# Patient Record
Sex: Female | Born: 1950 | Race: White | Hispanic: No | Marital: Single | Smoking: Never smoker
Health system: Southern US, Community
[De-identification: ages and names within clinical notes are randomized; demographics above are authoritative.]

## PROBLEM LIST (undated history)

## (undated) DIAGNOSIS — E559 Vitamin D deficiency, unspecified: Secondary | ICD-10-CM

## (undated) DIAGNOSIS — C801 Malignant (primary) neoplasm, unspecified: Secondary | ICD-10-CM

## (undated) DIAGNOSIS — E663 Overweight: Secondary | ICD-10-CM

## (undated) DIAGNOSIS — T7840XA Allergy, unspecified, initial encounter: Secondary | ICD-10-CM

## (undated) HISTORY — DX: Malignant (primary) neoplasm, unspecified: C80.1

## (undated) HISTORY — DX: Allergy, unspecified, initial encounter: T78.40XA

## (undated) HISTORY — PX: TONSILLECTOMY AND ADENOIDECTOMY: SHX28

## (undated) HISTORY — PX: ENDOMETRIAL ABLATION: SHX621

## (undated) HISTORY — DX: Vitamin D deficiency, unspecified: E55.9

## (undated) HISTORY — DX: Overweight: E66.3

---

## 1998-11-21 ENCOUNTER — Other Ambulatory Visit: Admission: RE | Admit: 1998-11-21 | Discharge: 1998-11-21 | Payer: Self-pay | Admitting: Obstetrics and Gynecology

## 2000-09-17 ENCOUNTER — Other Ambulatory Visit: Admission: RE | Admit: 2000-09-17 | Discharge: 2000-09-17 | Payer: Self-pay | Admitting: *Deleted

## 2002-02-22 ENCOUNTER — Other Ambulatory Visit: Admission: RE | Admit: 2002-02-22 | Discharge: 2002-02-22 | Payer: Self-pay | Admitting: Obstetrics and Gynecology

## 2003-05-15 ENCOUNTER — Other Ambulatory Visit: Admission: RE | Admit: 2003-05-15 | Discharge: 2003-05-15 | Payer: Self-pay | Admitting: Obstetrics and Gynecology

## 2006-02-17 ENCOUNTER — Encounter: Admission: RE | Admit: 2006-02-17 | Discharge: 2006-02-17 | Payer: Self-pay | Admitting: *Deleted

## 2007-03-16 ENCOUNTER — Encounter: Admission: RE | Admit: 2007-03-16 | Discharge: 2007-03-16 | Payer: Self-pay | Admitting: Obstetrics and Gynecology

## 2009-06-18 ENCOUNTER — Encounter: Admission: RE | Admit: 2009-06-18 | Discharge: 2009-06-18 | Payer: Self-pay | Admitting: Internal Medicine

## 2009-06-26 ENCOUNTER — Encounter: Admission: RE | Admit: 2009-06-26 | Discharge: 2009-06-26 | Payer: Self-pay | Admitting: Internal Medicine

## 2010-05-12 ENCOUNTER — Encounter: Payer: Self-pay | Admitting: Internal Medicine

## 2011-10-08 ENCOUNTER — Encounter: Payer: Self-pay | Admitting: Family Medicine

## 2011-10-14 ENCOUNTER — Ambulatory Visit: Payer: BC Managed Care – PPO

## 2011-10-14 ENCOUNTER — Ambulatory Visit (INDEPENDENT_AMBULATORY_CARE_PROVIDER_SITE_OTHER): Payer: BC Managed Care – PPO | Admitting: Family Medicine

## 2011-10-14 VITALS — BP 136/84 | HR 82 | Temp 98.3°F | Resp 16 | Ht 67.0 in | Wt 196.0 lb

## 2011-10-14 DIAGNOSIS — M79673 Pain in unspecified foot: Secondary | ICD-10-CM

## 2011-10-14 DIAGNOSIS — R5383 Other fatigue: Secondary | ICD-10-CM

## 2011-10-14 DIAGNOSIS — Z0000 Encounter for general adult medical examination without abnormal findings: Secondary | ICD-10-CM

## 2011-10-14 DIAGNOSIS — M79609 Pain in unspecified limb: Secondary | ICD-10-CM

## 2011-10-14 DIAGNOSIS — E7800 Pure hypercholesterolemia, unspecified: Secondary | ICD-10-CM

## 2011-10-14 DIAGNOSIS — Z01419 Encounter for gynecological examination (general) (routine) without abnormal findings: Secondary | ICD-10-CM

## 2011-10-14 DIAGNOSIS — R5381 Other malaise: Secondary | ICD-10-CM

## 2011-10-14 LAB — POCT CBC
HCT, POC: 42.4 % (ref 37.7–47.9)
Hemoglobin: 13.8 g/dL (ref 12.2–16.2)
Lymph, poc: 2.1 (ref 0.6–3.4)
MCH, POC: 28.8 pg (ref 27–31.2)
MCHC: 32.5 g/dL (ref 31.8–35.4)
WBC: 5.8 10*3/uL (ref 4.6–10.2)

## 2011-10-14 LAB — POCT UA - MICROSCOPIC ONLY
Casts, Ur, LPF, POC: NEGATIVE
Yeast, UA: NEGATIVE

## 2011-10-14 LAB — POCT URINALYSIS DIPSTICK
Bilirubin, UA: NEGATIVE
Leukocytes, UA: NEGATIVE
Nitrite, UA: NEGATIVE
Urobilinogen, UA: 0.2
pH, UA: 5.5

## 2011-10-14 LAB — LIPID PANEL
LDL Cholesterol: 122 mg/dL — ABNORMAL HIGH (ref 0–99)
Triglycerides: 101 mg/dL (ref <150)
VLDL: 20 mg/dL (ref 0–40)

## 2011-10-14 LAB — COMPREHENSIVE METABOLIC PANEL
CO2: 26 mEq/L (ref 19–32)
Calcium: 9.4 mg/dL (ref 8.4–10.5)
Creat: 0.82 mg/dL (ref 0.50–1.10)
Glucose, Bld: 93 mg/dL (ref 70–99)
Sodium: 142 mEq/L (ref 135–145)
Total Bilirubin: 0.5 mg/dL (ref 0.3–1.2)
Total Protein: 6.9 g/dL (ref 6.0–8.3)

## 2011-10-14 LAB — TSH: TSH: 0.973 u[IU]/mL (ref 0.350–4.500)

## 2011-10-14 MED ORDER — BUPROPION HCL ER (SR) 150 MG PO TB12: 150.0000 mg | ORAL_TABLET | Freq: Two times a day (BID) | ORAL | Status: DC

## 2011-10-14 MED ORDER — FLUTICASONE PROPIONATE 50 MCG/ACT NA SUSP: 2.0000 | Freq: Every day | NASAL | Status: DC

## 2011-10-14 NOTE — Progress Notes (Signed)
  Subjective:    Patient ID: Linda Cisneros, female    DOB: November 01, 1950, 61 y.o.   MRN: [id number]  HPI60 yr old CF here for CPE. See patient health survey that is scanned in. Patient c/o overeating/weight gain.  Also fatigue. H/O vitamin D deficiency.  C/O L foot pain since turning her foot/tripping over a step when exercise walking about 1 year ago. Esp aggravated by certain shoes.  Dermatology check up for melanoma was fine.  Review of Systems  All other systems reviewed and are negative.       Objective:   Physical Exam    Results for orders placed in visit on 10/14/11  POCT CBC      Component Value Range   WBC 5.8  4.6 - 10.2 K/uL   Lymph, poc 2.1  0.6 - 3.4   POC LYMPH PERCENT 35.8  10 - 50 %L   MID (cbc) 0.4  0 - 0.9   POC MID % 6.8  0 - 12 %M   POC Granulocyte 3.3  2 - 6.9   Granulocyte percent 57.4  37 - 80 %G   RBC 4.80  4.04 - 5.48 M/uL   Hemoglobin 13.8  12.2 - 16.2 g/dL   HCT, POC 40.9  81.1 - 47.9 %   MCV 88.4  80 - 97 fL   MCH, POC 28.8  27 - 31.2 pg   MCHC 32.5  31.8 - 35.4 g/dL   RDW, POC 91.4     Platelet Count, POC 328  142 - 424 K/uL   MPV 9.0  0 - 99.8 fL  POCT URINALYSIS DIPSTICK      Component Value Range   Color, UA yellow     Clarity, UA clear     Glucose, UA neg     Bilirubin, UA neg     Ketones, UA neg     Spec Grav, UA 1.025     Blood, UA neg     pH, UA 5.5     Protein, UA neg     Urobilinogen, UA 0.2     Nitrite, UA neg     Leukocytes, UA Negative    POCT UA - MICROSCOPIC ONLY      Component Value Range   WBC, Ur, HPF, POC neg     RBC, urine, microscopic 0-1     Bacteria, U Microscopic trace     Mucus, UA neg     Epithelial cells, urine per micros 3-5     Crystals, Ur, HPF, POC neg     Casts, Ur, LPF, POC neg     Yeast, UA neg     [facility] reading (PRIMARY) by  Dr. Patsy Lager as NAD.      Assessment & Plan:  CPE Obesity-resume walking Fatigue-healthy diet to increase energy.  Advised [employer] Diet. Decrease  alcohol/salt/sugar and other white carbs.  Foot pain-?morton's neuroma.  Advised comfortable footwear and arch support. Overeating-I believe she has an anxiety component that may be helped by a trial of Wellbutrin. Rhinitis-start flonase Gave SBE card for shower.  Taught and encouraged and she will make an appt to get her MMG for this year.

## 2011-10-14 NOTE — Patient Instructions (Signed)
Read and try [facility] Diet

## 2011-10-15 ENCOUNTER — Telehealth: Payer: Self-pay

## 2011-10-15 LAB — VITAMIN D 25 HYDROXY (VIT D DEFICIENCY, FRACTURES): Vit D, 25-Hydroxy: 27 ng/mL — ABNORMAL LOW (ref 30–89)

## 2011-10-15 MED ORDER — VITAMIN D (ERGOCALCIFEROL) 1.25 MG (50000 UNIT) PO CAPS: 50000.0000 [IU] | ORAL_CAPSULE | ORAL | Status: DC

## 2011-10-15 NOTE — Telephone Encounter (Signed)
Pt returning phone call regarding lab results please contact pt at work [phone] ext [phone]

## 2011-10-15 NOTE — Telephone Encounter (Signed)
See labs 

## 2011-10-15 NOTE — Addendum Note (Signed)
Addended by: Anders Simmonds on: 10/15/2011 07:39 AM   Modules accepted: Orders

## 2011-10-16 LAB — PAP IG (IMAGE GUIDED)

## 2011-10-21 ENCOUNTER — Other Ambulatory Visit: Payer: Self-pay | Admitting: Physician Assistant

## 2012-01-26 LAB — HM MAMMOGRAPHY: HM Mammogram: NEGATIVE

## 2012-03-16 ENCOUNTER — Encounter: Payer: Self-pay | Admitting: Radiology

## 2012-10-14 ENCOUNTER — Ambulatory Visit (INDEPENDENT_AMBULATORY_CARE_PROVIDER_SITE_OTHER): Payer: BC Managed Care – PPO | Admitting: Family Medicine

## 2012-10-14 ENCOUNTER — Encounter: Payer: Self-pay | Admitting: Family Medicine

## 2012-10-14 VITALS — BP 156/85 | HR 91 | Temp 97.9°F | Resp 16 | Ht 67.0 in | Wt 196.0 lb

## 2012-10-14 DIAGNOSIS — Z0000 Encounter for general adult medical examination without abnormal findings: Secondary | ICD-10-CM

## 2012-10-14 DIAGNOSIS — J309 Allergic rhinitis, unspecified: Secondary | ICD-10-CM

## 2012-10-14 DIAGNOSIS — Z789 Other specified health status: Secondary | ICD-10-CM

## 2012-10-14 DIAGNOSIS — E559 Vitamin D deficiency, unspecified: Secondary | ICD-10-CM

## 2012-10-14 LAB — POCT URINALYSIS DIPSTICK
Bilirubin, UA: NEGATIVE
Glucose, UA: NEGATIVE
Leukocytes, UA: NEGATIVE
Nitrite, UA: NEGATIVE
Urobilinogen, UA: 0.2

## 2012-10-14 LAB — BASIC METABOLIC PANEL
Calcium: 9.3 mg/dL (ref 8.4–10.5)
Potassium: 4.1 mEq/L (ref 3.5–5.3)
Sodium: 140 mEq/L (ref 135–145)

## 2012-10-14 LAB — LDL CHOLESTEROL, DIRECT: Direct LDL: 122 mg/dL — ABNORMAL HIGH

## 2012-10-14 MED ORDER — FLUTICASONE PROPIONATE 50 MCG/ACT NA SUSP: 2.0000 | Freq: Every day | NASAL | Status: DC

## 2012-10-14 NOTE — Progress Notes (Signed)
Subjective:    Patient ID: Linda Cisneros, female    DOB: January 22, 1951, 62 y.o.   MRN: [id number]  HPI  This 62 y.o. Cauc female is here for CPE; PAP is not needed. Pt has a hx of Vit D def but is  not taking a supplement. She also has seasonal allergies and uses OTC antihistamine prn.    HCM: PAP- current (sees GYN).            MMG- current (per GYN)     PMHx, Soc Hx and Fam Hx reviewed.   Review of Systems  Constitutional: Positive for fatigue. Negative for fever, diaphoresis, activity change and appetite change.  HENT: Positive for sinus pressure. Negative for congestion, sore throat, rhinorrhea and postnasal drip.   Eyes: Positive for itching.  Respiratory: Negative.   Cardiovascular: Negative.   Gastrointestinal: Positive for nausea.  All other systems reviewed and are negative.       Objective:   Physical Exam  Nursing note and vitals reviewed. Constitutional: She is oriented to person, place, and time. Vital signs are normal. She appears well-developed and well-nourished. No distress.  HENT:  Head: Normocephalic and atraumatic.  Right Ear: Hearing, tympanic membrane, external ear and ear canal normal.  Left Ear: Hearing, tympanic membrane, external ear and ear canal normal.  Nose: Nose normal. No mucosal edema, rhinorrhea, nasal deformity or septal deviation. Right sinus exhibits no maxillary sinus tenderness and no frontal sinus tenderness. Left sinus exhibits no maxillary sinus tenderness and no frontal sinus tenderness.  Mouth/Throat: Uvula is midline, oropharynx is clear and moist and mucous membranes are normal. No oral lesions. Normal dentition. No dental caries.  Eyes: Conjunctivae and EOM are normal. Pupils are equal, round, and reactive to light. No scleral icterus.  Neck: Normal range of motion. Neck supple. No thyromegaly present.  Cardiovascular: Normal rate, regular rhythm and normal heart sounds.  Exam reveals no gallop and no friction rub.   No murmur  heard. Pulmonary/Chest: Effort normal and breath sounds normal. No respiratory distress.  Abdominal: Soft. Normal appearance and bowel sounds are normal. She exhibits no distension, no pulsatile midline mass and no mass. There is no hepatosplenomegaly. There is no tenderness. There is no guarding and no CVA tenderness.  Genitourinary: Rectum normal. Rectal exam shows no external hemorrhoid, no fissure, no mass, no tenderness and anal tone normal. Guaiac negative stool.  NEFG.  Musculoskeletal: Normal range of motion. She exhibits no edema and no tenderness.  Lymphadenopathy:       Head (right side): No submental, no submandibular, no tonsillar, no posterior auricular and no occipital adenopathy present.       Head (left side): No submental, no submandibular, no tonsillar, no posterior auricular and no occipital adenopathy present.    She has no cervical adenopathy.       Right: No supraclavicular adenopathy present.       Left: No supraclavicular adenopathy present.  Neurological: She is alert and oriented to person, place, and time. She has normal strength and normal reflexes. She displays no atrophy. No cranial nerve deficit or sensory deficit. She exhibits normal muscle tone. Coordination and gait normal.  Skin: Skin is warm and dry. No rash noted. No erythema. No pallor.  Psychiatric: She has a normal mood and affect. Her behavior is normal. Judgment and thought content normal.    Results for orders placed in visit on 10/14/12  POCT URINALYSIS DIPSTICK      Result Value Range   Color, UA yellow  Clarity, UA slightly cloudy     Glucose, UA neg     Bilirubin, UA neg     Ketones, UA neg     Spec Grav, UA 1.020     Blood, UA neg     pH, UA 6.5     Protein, UA neg     Urobilinogen, UA 0.2     Nitrite, UA neg     Leukocytes, UA Negative    IFOBT (OCCULT BLOOD)      Result Value Range   IFOBT Negative         Assessment & Plan:  Routine general medical examination at a health  care facility - Plan: Basic metabolic panel, POCT urinalysis dipstick, IFOBT POC (occult bld, rslt in office)  Unspecified vitamin D deficiency - Advised OTC Vit D3 supplement daily or Calcium + D supplement.    Plan: Vitamin D, 25-hydroxy  Allergic rhinitis- Advised pt to take allergy medication daily.  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

## 2012-10-14 NOTE — Patient Instructions (Signed)
Keeping You Healthy  Get These Tests  Blood Pressure- Have your blood pressure checked by your healthcare provider at least once a year.  Normal blood pressure is 120/80.  Weight- Have your body mass index (BMI) calculated to screen for obesity.  BMI is a measure of body fat based on height and weight.  You can calculate your own BMI at https://www.west-esparza.com/  Cholesterol- Have your cholesterol checked every year.  Diabetes- Have your blood sugar checked every year if you have high blood pressure, high cholesterol, a family history of diabetes or if you are overweight.  Pap Smear- Have a pap smear every 1 to 3 years if you have been sexually active.  If you are older than 65 and recent pap smears have been normal you may not need additional pap smears.  In addition, if you have had a hysterectomy  For benign disease additional pap smears are not necessary.  Mammogram-Yearly mammograms are essential for early detection of breast cancer  Screening for Colon Cancer- Colonoscopy starting at age 60. Screening may begin sooner depending on your family history and other health conditions.  Follow up colonoscopy as directed by your Gastroenterologist.  Screening for Osteoporosis- Screening begins at age 60 with bone density scanning, sooner if you are at higher risk for developing Osteoporosis.  Get these medicines  Calcium with Vitamin D- Your body requires 1200-1500 mg of Calcium a day and [id number] IU of Vitamin D a day.  You can only absorb 500 mg of Calcium at a time therefore Calcium must be taken in 2 or 3 separate doses throughout the day.  Hormones- Hormone therapy has been associated with increased risk for certain cancers and heart disease.  Talk to your healthcare provider about if you need relief from menopausal symptoms.  Aspirin- Ask your healthcare provider about taking Aspirin to prevent Heart Disease and Stroke.  Get these Immuniztions  Flu shot- Every fall  Pneumonia  shot- Once after the age of 31; if you are younger ask your healthcare provider if you need a pneumonia shot.  Tetanus- Every ten years. You received a Tdap in 2010; next Tetanus is due in 2020.  Zostavax- Once after the age of 47 to prevent shingles. You have a prescription for this vaccine; take it at [facility] or [employer] at [facility].  Take these steps  Don't smoke- Your healthcare provider can help you quit. For tips on how to quit, ask your healthcare provider or go to www.smokefree.gov or call 1-800 QUIT-NOW.  Be physically active- Exercise 5 days a week for a minimum of 30 minutes.  If you are not already physically active, start slow and gradually work up to 30 minutes of moderate physical activity.  Try walking, dancing, bike riding, swimming, etc.  Eat a healthy diet- Eat a variety of healthy foods such as fruits, vegetables, whole grains, low fat milk, low fat cheeses, yogurt, lean meats, chicken, fish, eggs, dried beans, tofu, etc.  For more information go to www.thenutritionsource.org  Dental visit- Brush and floss teeth twice daily; visit your dentist twice a year.  Eye exam- Visit your Optometrist or Ophthalmologist yearly.  Drink alcohol in moderation- Limit alcohol intake to one drink or less a day.  Never drink and drive.  Depression- Your emotional health is as important as your physical health.  If you're feeling down or losing interest in things you normally enjoy, please talk to your healthcare provider.  Seat Belts- can save your life; always  wear one  Smoke/Carbon Monoxide detectors- These detectors need to be installed on the appropriate level of your home.  Replace batteries at least once a year.  Violence- If anyone is threatening or hurting you, please tell your healthcare provider.  Living Will/ Health care power of attorney- Discuss with your healthcare provider and family.   Vitamin D Deficiency Vitamin D is an important vitamin  that your body needs. Having too little of it in your body is called a deficiency. A very bad deficiency can make your bones soft and can cause a condition called rickets.  Vitamin D is important to your body for different reasons, such as:   It helps your body absorb 2 minerals called calcium and phosphorus.  It helps make your bones healthy.  It may prevent some diseases, such as diabetes and multiple sclerosis.  It helps your muscles and heart. You can get vitamin D in several ways. It is a natural part of some foods. The vitamin is also added to some dairy products and cereals. Some people take vitamin D supplements. Also, your body makes vitamin D when you are in the sun. It changes the sun's rays into a form of the vitamin that your body can use. CAUSES   Not eating enough foods that contain vitamin D.  Not getting enough sunlight.  Having certain digestive system diseases that make it hard to absorb vitamin D. These diseases include Crohn's disease, chronic pancreatitis, and cystic fibrosis.  Having a surgery in which part of the stomach or small intestine is removed.  Being obese. Fat cells pull vitamin D out of your blood. That means that obese people may not have enough vitamin D left in their blood and in other body tissues.  Having chronic kidney or liver disease. RISK FACTORS Risk factors are things that make you more likely to develop a vitamin D deficiency. They include:  Being older.  Not being able to get outside very much.  Living in a nursing home.  Having had broken bones.  Having weak or thin bones (osteoporosis).  Having a disease or condition that changes how your body absorbs vitamin D.  Having dark skin.  Some medicines such as seizure medicines or steroids.  Being overweight or obese. SYMPTOMS Mild cases of vitamin D deficiency may not have any symptoms. If you have a very bad case, symptoms may include:  Bone pain.  Muscle pain.  Falling  often.  Broken bones caused by a minor injury, due to osteoporosis. DIAGNOSIS A blood test is the best way to tell if you have a vitamin D deficiency. TREATMENT Vitamin D deficiency can be treated in different ways. Treatment for vitamin D deficiency depends on what is causing it. Options include:  Taking vitamin D supplements.  Taking a calcium supplement. Your caregiver will suggest what dose is best for you. HOME CARE INSTRUCTIONS  Take any supplements that your caregiver prescribes. Follow the directions carefully. Take only the suggested amount.  Have your blood tested 2 months after you start taking supplements.  Eat foods that contain vitamin D. Healthy choices include:  Fortified dairy products, cereals, or juices. Fortified means vitamin D has been added to the food. Check the label on the package to be sure.  Fatty fish like salmon or trout.  Eggs.  Oysters.  Spend some time in the sun. Most people should get out in the sun without sunblock for 10 to 15 minutes at a time. Do this 3 times a  week. People who have had skin cancer should not do this. Ask your caregiver how long you should be in the sun. Do not use a tanning bed.  Keep your weight at a healthy level. Lose weight if you need to.  Keep all follow-up appointments. Your caregiver will need to perform blood tests to make sure your vitamin D deficiency is going away. SEEK MEDICAL CARE IF:  You have any questions about your treatment.  You continue to have symptoms of vitamin D deficiency.  You have nausea or vomiting.  You are constipated.  You feel confused.  You have severe abdominal or back pain. MAKE SURE YOU:  Understand these instructions.  Will watch your condition.  Will get help right away if you are not doing well or get worse. Document Released: 06/30/2011 Document Reviewed: 06/30/2011 [facility] Patient Information 2014 [place], [place].   You may want to get Citracal + D or  Oscal +D  for calcium and Vit D supplementation. This is recommended for women after they stop having periods.

## 2012-10-15 DIAGNOSIS — E663 Overweight: Secondary | ICD-10-CM | POA: Insufficient documentation

## 2012-10-15 DIAGNOSIS — Z789 Other specified health status: Secondary | ICD-10-CM | POA: Insufficient documentation

## 2012-10-15 DIAGNOSIS — E559 Vitamin D deficiency, unspecified: Secondary | ICD-10-CM | POA: Insufficient documentation

## 2012-10-15 DIAGNOSIS — J309 Allergic rhinitis, unspecified: Secondary | ICD-10-CM | POA: Insufficient documentation

## 2012-10-15 LAB — VITAMIN D 25 HYDROXY (VIT D DEFICIENCY, FRACTURES): Vit D, 25-Hydroxy: 28 ng/mL — ABNORMAL LOW (ref 30–89)

## 2012-10-16 NOTE — Progress Notes (Signed)
Quick Note:  Please contact pt and advise that the following labs are abnormal... Vitamin D level is about the same as 1 year ago. Get OTC Vitamin D3 2000 IU and take it daily. Get some sun exposure for 10-15 minutes most days of the week.  Kidney function, blood sugar and electrolytes are normal. LDL ("bad") cholesterol is above 100; work on healthier nutrition and regular exercise.  Copy to pt. ______

## 2012-10-18 ENCOUNTER — Encounter: Payer: Self-pay | Admitting: Family Medicine

## 2012-10-19 ENCOUNTER — Telehealth: Payer: Self-pay

## 2012-10-19 NOTE — Telephone Encounter (Signed)
PT STATES SHE RECEIVED A CALL REGARDING HER LABS. PLEASE CALL HER WORK AT [phone] [phone]

## 2012-10-19 NOTE — Telephone Encounter (Signed)
  Notes Recorded by Maurice March, MD on 10/16/2012 at 4:35 PM Please contact pt and advise that the following labs are abnormal... Vitamin D level is about the same as 1 year ago. Get OTC Vitamin D3 2000 IU and take it daily. Get some sun exposure for 10-15 minutes most days of the week.  Kidney function, blood sugar and electrolytes are normal. LDL ("bad") cholesterol is above 100; work on healthier nutrition and regular exercise.  Copy to pt.  Patient advised.

## 2012-11-05 ENCOUNTER — Ambulatory Visit (INDEPENDENT_AMBULATORY_CARE_PROVIDER_SITE_OTHER): Payer: BC Managed Care – PPO | Admitting: Emergency Medicine

## 2012-11-05 VITALS — BP 120/80 | HR 86 | Temp 98.0°F | Resp 16 | Ht 65.5 in | Wt 196.0 lb

## 2012-11-05 DIAGNOSIS — K120 Recurrent oral aphthae: Secondary | ICD-10-CM

## 2012-11-05 MED ORDER — MAGIC MOUTHWASH W/LIDOCAINE: 10.0000 mL | ORAL | Status: DC | PRN

## 2012-11-05 NOTE — Patient Instructions (Addendum)
Canker Sores   Canker sores are painful, open sores on the inside of the mouth and cheek. They may be white or yellow. The sores usually heal in 1 to 2 weeks. Women are more likely than men to have recurrent canker sores.  CAUSES  The cause of canker sores is not well understood. More than one cause is likely. Canker sores do not appear to be caused by certain types of germs (viruses or bacteria). Canker sores may be caused by:   An allergic reaction to certain foods.   Digestive problems.   Not having enough vitamin B12, folic acid, and iron.   Female sex hormones. Sores may come only during certain phases of a menstrual cycle. Often, there is improvement during pregnancy.   Genetics. Some people seem to inherit canker sore problems.  Emotional stress and injuries to the mouth may trigger outbreaks, but not cause them.   DIAGNOSIS  Canker sores are diagnosed by exam.   TREATMENT   Patients who have frequent bouts of canker sores may have cultures taken of the sores, blood tests, or allergy tests. This helps determine if their sores are caused by a poor diet, an allergy, or some other preventable or treatable disease.   Vitamins may prevent recurrences or reduce the severity of canker sores in people with poor nutrition.   Numbing ointments can relieve pain. These are available in drug stores without a prescription.   Anti-inflammatory steroid mouth rinses or gels may be prescribed by your caregiver for severe sores.   Oral steroids may be prescribed if you have severe, recurrent canker sores. These strong medicines can cause many side effects and should be used only under the close direction of a dentist or physician.   Mouth rinses containing the antibiotic medicine may be prescribed. They may lessen symptoms and speed healing.  Healing usually happens in about 1 or 2 weeks with or without treatment. Certain antibiotic mouth rinses given to pregnant women and young children can permanently stain teeth.  Talk to your caregiver about your treatment.  HOME CARE INSTRUCTIONS    Avoid foods that cause canker sores for you.   Avoid citrus juices, spicy or salty foods, and coffee until the sores are healed.   Use a soft-bristled toothbrush.   Chew your food carefully to avoid biting your cheek.   Apply topical numbing medicine to the sore to help relieve pain.   Apply a thin paste of baking soda and water to the sore to help heal the sore.   Only use mouth rinses or medicines for pain or discomfort as directed by your caregiver.  SEEK MEDICAL CARE IF:    Your symptoms are not better in 1 week.   Your sores are still present after 2 weeks.   Your sores are very painful.   You have trouble breathing or swallowing.   Your sores come back frequently.  Document Released: 08/02/2010 Document Revised: 06/30/2011 Document Reviewed: 08/02/2010  [employer] Patient Information 2014 [employer].

## 2012-11-05 NOTE — Progress Notes (Signed)
[facility] [address], [place] [address] [phone]  Date:  11/05/2012   Name:  Linda Cisneros   DOB:  24-Jul-1950   MRN:  [id number]  PCP:  Dow Adolph, MD    Chief Complaint: blisters   History of Present Illness:  Linda Cisneros is a 62 y.o. very [place] female patient who presents with the following:  Found blisters on her tongue last week.  They have spread to the other side of her mouth now and she has hoarseness. Some dysphagia.  Some mucoid nasal drainage.  No fever or chills, cough, wheezing or shortness of breath.  No nausea or vomiting.  No improvement with over the counter medications or other home remedies. Denies other complaint or health concern today.   Patient Active Problem List   Diagnosis Date Noted  . Unspecified vitamin D deficiency 10/15/2012  . Allergic rhinitis 10/15/2012  . 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 Overweight 10/15/2012    Past Medical History  Diagnosis Date  . Allergy     Past Surgical History  Procedure Laterality Date  . Endometrial ablation    . Tonsillectomy and adenoidectomy      History  Substance Use Topics  . Smoking status: Never Smoker   . Smokeless tobacco: Not on file  . Alcohol Use: Not on file    Family History  Problem Relation Age of Onset  . COPD Father   . Heart disease Sister     Allergies  Allergen Reactions  . Tetracyclines & Related Rash    Medication list has been reviewed and updated.  Current Outpatient Prescriptions on File Prior to Visit  Medication Sig Dispense Refill  . fluticasone (FLONASE) 50 MCG/ACT nasal spray Place 2 sprays into the nose daily.  16 g  6   No current facility-administered medications on file prior to visit.    Review of Systems:  As per HPI, otherwise negative.    Physical Examination: Filed Vitals:   11/05/12 1033  BP: 120/80  Pulse: 86  Temp: 98 F (36.7  C)  Resp: 16   Filed Vitals:   11/05/12 1033  Height: 5' 5.5" (1.664 m)  Weight: 196 lb (88.905 kg)   Body mass index is 32.11 kg/(m^2). Ideal Body Weight: Weight in (lb) to have BMI = 25: 152.2  GEN: WDWN, NAD, Non-toxic, A & O x 3 HEENT: Atraumatic, Normocephalic. Neck supple. No masses, No LAD.  Aphthous ulcers  Ears and Nose: No external deformity. CV: RRR, No M/G/R. No JVD. No thrill. No extra heart sounds. PULM: CTA B, no wheezes, crackles, rhonchi. No retractions. No resp. distress. No accessory muscle use. ABD: S, NT, ND, +BS. No rebound. No HSM. EXTR: No c/c/e NEURO Normal gait.  PSYCH: Normally interactive. Conversant. Not depressed or anxious appearing.  Calm demeanor.    Assessment and Plan: Aphthous ulcerations Dukes mouthwash   Signed,  Phillips Odor, MD

## 2013-01-28 ENCOUNTER — Ambulatory Visit (INDEPENDENT_AMBULATORY_CARE_PROVIDER_SITE_OTHER): Payer: BC Managed Care – PPO | Admitting: Family Medicine

## 2013-01-28 VITALS — BP 128/84 | HR 81 | Temp 98.6°F | Resp 18 | Ht 65.5 in | Wt 196.0 lb

## 2013-01-28 DIAGNOSIS — H8110 Benign paroxysmal vertigo, unspecified ear: Secondary | ICD-10-CM

## 2013-01-28 DIAGNOSIS — J3489 Other specified disorders of nose and nasal sinuses: Secondary | ICD-10-CM

## 2013-01-28 MED ORDER — AMOXICILLIN-POT CLAVULANATE 875-125 MG PO TABS: 1.0000 | ORAL_TABLET | Freq: Two times a day (BID) | ORAL | Status: DC

## 2013-01-28 NOTE — Progress Notes (Signed)
[facility] [address], [place] [address] [phone]  Date:  01/28/2013   Name:  Linda Cisneros   DOB:  12/13/1950   MRN:  [id number]  PCP:  Dow Adolph, MD    Chief Complaint: sinus issues and vitamin d level was drawn last visit   History of Present Illness:  Linda Cisneros is a 62 y.o. very List female patient who presents with the following:  For about 2 weeks she had copious nasal discharge and congestion.  This is now the 3rd week- her nasal sx have improved but a couple of days ago she noted some vertigo with head movement.  This happened twice, lasted just a moment and was associated with moving her head or rolling over in bed.    Her congestion is now better.  Her left ear hurts a little bit.   She is no longer blowing much out of her sinuses.  She notes a "teeny tiny headache."   No cough.    No GI symptoms. She has felt tired.  She is generally healthy   Patient Active Problem List   Diagnosis Date Noted  . Unspecified vitamin D deficiency 10/15/2012  . Allergic rhinitis 10/15/2012  . 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 Overweight 10/15/2012    Past Medical History  Diagnosis Date  . Allergy   . Cancer     Past Surgical History  Procedure Laterality Date  . Endometrial ablation    . Tonsillectomy and adenoidectomy      History  Substance Use Topics  . Smoking status: Never Smoker   . Smokeless tobacco: Not on file  . Alcohol Use: Not on file    Family History  Problem Relation Age of Onset  . COPD Father   . Heart disease Sister     Allergies  Allergen Reactions  . Tetracyclines & Related Rash    Medication list has been reviewed and updated.  Current Outpatient Prescriptions on File Prior to Visit  Medication Sig Dispense Refill  . Alum & Mag Hydroxide-Simeth (MAGIC MOUTHWASH W/LIDOCAINE) SOLN Take 10 mLs by mouth every 2 (two) hours  as needed.  360 mL  0  . fluticasone (FLONASE) 50 MCG/ACT nasal spray Place 2 sprays into the nose daily.  16 g  6   No current facility-administered medications on file prior to visit.    Review of Systems:  As per HPI- otherwise negative.   Physical Examination: Filed Vitals:   01/28/13 1813  BP: 128/84  Pulse: 81  Temp: 98.6 F (37 C)  Resp: 18   Filed Vitals:   01/28/13 1813  Height: 5' 5.5" (1.664 m)  Weight: 196 lb (88.905 kg)   Body mass index is 32.11 kg/(m^2). Ideal Body Weight: Weight in (lb) to have BMI = 25: 152.2  GEN: WDWN, NAD, Non-toxic, A & O x 3, overweight, looks well HEENT: Atraumatic, Normocephalic. Neck supple. No masses, No LAD.  Bilateral TM wnl, oropharynx normal.  PEERL,EOMI.   Ears and Nose: No external deformity. CV: RRR, No M/G/R. No JVD. No thrill. No extra heart sounds. PULM: CTA B, no wheezes, crackles, rhonchi. No retractions. No resp. distress. No accessory muscle use. ABD: S, NT, ND. No rebound. No HSM. EXTR: No c/c/e NEURO Normal gait. Normal strength, sensation, DTR all extremities, normal RAM of hands, negative romberg PSYCH: Normally interactive. Conversant. Not depressed or anxious appearing.  Calm demeanor.   Able  to reproduce vertigo with Tonia Brooms   Assessment and Plan: Sinus pressure - Plan: amoxicillin-clavulanate (AUGMENTIN) 875-125 MG per tablet  BPPV (benign paroxysmal positional vertigo)  Likely BPPV- mild and not currently bothersome.  Discussed with her.  She will let me know if sx persist.   Gave rx for augmentin to hold.  At this time her sinus sx are improving but she can use this rx if sinus pressure, pain and congestion return.   Signed Abbe Amsterdam, MD

## 2013-01-28 NOTE — Patient Instructions (Signed)
It appears that you have BPPV.  If your symptoms persist for more than a couple of days please give me a call. If you have persistent pressure in your sinuses take the augmentin rx.    If you have any severe headache or other concerning symptoms please let me know right away  Benign Positional Vertigo Vertigo means you feel like you or your surroundings are moving when they are not. Benign positional vertigo is the most common form of vertigo. Benign means that the cause of your condition is not serious. Benign positional vertigo is more common in older adults. CAUSES  Benign positional vertigo is the result of an upset in the labyrinth system. This is an area in the middle ear that helps control your balance. This may be caused by a viral infection, head injury, or repetitive motion. However, often no specific cause is found. SYMPTOMS  Symptoms of benign positional vertigo occur when you move your head or eyes in different directions. Some of the symptoms may include:  Loss of balance and falls.  Vomiting.  Blurred vision.  Dizziness.  Nausea.  Involuntary eye movements (nystagmus). DIAGNOSIS  Benign positional vertigo is usually diagnosed by physical exam. If the specific cause of your benign positional vertigo is unknown, your caregiver may perform imaging tests, such as magnetic resonance imaging (MRI) or computed tomography (CT). TREATMENT  Your caregiver may recommend movements or procedures to correct the benign positional vertigo. Medicines such as meclizine, benzodiazepines, and medicines for nausea may be used to treat your symptoms. In rare cases, if your symptoms are caused by certain conditions that affect the inner ear, you may need surgery. HOME CARE INSTRUCTIONS   Follow your caregiver's instructions.  Move slowly. Do not make sudden body or head movements.  Avoid driving.  Avoid operating heavy machinery.  Avoid performing any tasks that would be dangerous to you  or others during a vertigo episode.  Drink enough fluids to keep your urine clear or pale yellow. SEEK IMMEDIATE MEDICAL CARE IF:   You develop problems with walking, weakness, numbness, or using your arms, hands, or legs.  You have difficulty speaking.  You develop severe headaches.  Your nausea or vomiting continues or gets worse.  You develop visual changes.  Your family or friends notice any behavioral changes.  Your condition gets worse.  You have a fever.  You develop a stiff neck or sensitivity to light. MAKE SURE YOU:   Understand these instructions.  Will watch your condition.  Will get help right away if you are not doing well or get worse. Document Released: 01/13/2006 Document Revised: 06/30/2011 Document Reviewed: 12/26/2010 [facility] Patient Information 2014 [place], [place].

## 2013-02-03 ENCOUNTER — Telehealth: Payer: Self-pay | Admitting: Family Medicine

## 2013-02-04 ENCOUNTER — Telehealth: Payer: Self-pay

## 2013-02-04 DIAGNOSIS — H9313 Tinnitus, bilateral: Secondary | ICD-10-CM

## 2013-02-04 NOTE — Telephone Encounter (Signed)
Patient is looking for a referral for an earache, anyplace but [facility]. When asked who is calling just give your name.  Dr. Patsy Lager patient - please call Monday.  [phone]

## 2013-02-07 ENCOUNTER — Telehealth: Payer: Self-pay | Admitting: *Deleted

## 2013-02-07 NOTE — Telephone Encounter (Addendum)
Linda Cisneros reports that her tinnitus continues, her ears feel sore and she has occasional vertigo still.  Pt referred to see Dr. Suszanne Conners- appt for this Wednesday.  She is aware

## 2013-03-07 NOTE — Telephone Encounter (Signed)
error 

## 2013-08-02 ENCOUNTER — Encounter: Payer: Self-pay | Admitting: Internal Medicine

## 2013-08-02 ENCOUNTER — Ambulatory Visit (INDEPENDENT_AMBULATORY_CARE_PROVIDER_SITE_OTHER): Payer: 59 | Admitting: Emergency Medicine

## 2013-08-02 VITALS — BP 158/92 | HR 67 | Temp 97.9°F | Resp 18 | Wt 204.0 lb

## 2013-08-02 DIAGNOSIS — K625 Hemorrhage of anus and rectum: Secondary | ICD-10-CM

## 2013-08-02 DIAGNOSIS — S335XXA Sprain of ligaments of lumbar spine, initial encounter: Secondary | ICD-10-CM

## 2013-08-02 LAB — POCT UA - MICROSCOPIC ONLY
Bacteria, U Microscopic: NEGATIVE
CASTS, UR, LPF, POC: NEGATIVE
Crystals, Ur, HPF, POC: NEGATIVE
MUCUS UA: NEGATIVE
RBC, urine, microscopic: NEGATIVE
WBC, Ur, HPF, POC: NEGATIVE
YEAST UA: NEGATIVE

## 2013-08-02 LAB — POCT CBC
GRANULOCYTE PERCENT: 61.9 % (ref 37–80)
HCT, POC: 44.8 % (ref 37.7–47.9)
Hemoglobin: 14.3 g/dL (ref 12.2–16.2)
Lymph, poc: 2 (ref 0.6–3.4)
MCH, POC: 29.1 pg (ref 27–31.2)
MCHC: 31.9 g/dL (ref 31.8–35.4)
MCV: 91.2 fL (ref 80–97)
MID (CBC): 0.4 (ref 0–0.9)
MPV: 9.2 fL (ref 0–99.8)
PLATELET COUNT, POC: 319 10*3/uL (ref 142–424)
POC GRANULOCYTE: 3.8 (ref 2–6.9)
POC LYMPH %: 32.3 % (ref 10–50)
POC MID %: 5.8 %M (ref 0–12)
RBC: 4.91 M/uL (ref 4.04–5.48)
RDW, POC: 14 %
WBC: 6.2 10*3/uL (ref 4.6–10.2)

## 2013-08-02 LAB — POCT URINALYSIS DIPSTICK
Bilirubin, UA: NEGATIVE
Blood, UA: NEGATIVE
Glucose, UA: NEGATIVE
KETONES UA: NEGATIVE
LEUKOCYTES UA: NEGATIVE
Nitrite, UA: NEGATIVE
PH UA: 5.5
PROTEIN UA: NEGATIVE
SPEC GRAV UA: 1.01
Urobilinogen, UA: 0.2

## 2013-08-02 LAB — IFOBT (OCCULT BLOOD): IFOBT: POSITIVE

## 2013-08-02 MED ORDER — CYCLOBENZAPRINE HCL 5 MG PO TABS: 5.0000 mg | ORAL_TABLET | Freq: Three times a day (TID) | ORAL | Status: DC | PRN

## 2013-08-02 MED ORDER — MELOXICAM 15 MG PO TABS: 15.0000 mg | ORAL_TABLET | Freq: Every day | ORAL | Status: DC

## 2013-08-02 NOTE — Progress Notes (Signed)
[facility] [address] [place] [address] [phone]  Date:  08/02/2013   Name:  Linda Cisneros   DOB:  12/10/1950   MRN:  [id number]  PCP:  Ellsworth Lennox, MD    Chief Complaint: Back Pain and Hematochezia   History of Present Illness:  Linda Cisneros is a 63 y.o. very [place] female patient who presents with the following:  3 month history of low back pain alternately in right or left lower back.  Non radiating.  No neuro symptoms.  No weakness.  No history of injury or overuse.  Has trouble getting out of bed in morning due to pain.   Twice has noted BRBPR on wiping.  No dysuria, urgency or frequency.  No vaginal bleeding.  No hemorrhoids, constipation or diarrhea.  Painless stools.  No masses. Last colonoscopy 7 years and had no polyps and told to come back in 10 years for a recheck.  No weight loss. Appetite is normal.  No improvement with over the counter medications or other home remedies. Denies other complaint or health concern today.   Patient Active Problem List   Diagnosis Date Noted  . Unspecified vitamin D deficiency 10/15/2012  . Allergic rhinitis 10/15/2012  . 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 Overweight 10/15/2012    Past Medical History  Diagnosis Date  . Allergy   . Cancer     Past Surgical History  Procedure Laterality Date  . Endometrial ablation    . Tonsillectomy and adenoidectomy      History  Substance Use Topics  . Smoking status: Never Smoker   . Smokeless tobacco: Not on file  . Alcohol Use: Not on file    Family History  Problem Relation Age of Onset  . COPD Father   . Heart disease Sister     Allergies  Allergen Reactions  . Tetracyclines & Related Rash    Medication list has been reviewed and updated.  Current Outpatient Prescriptions on File Prior to Visit  Medication Sig Dispense Refill  . fluticasone (FLONASE) 50 MCG/ACT  nasal spray Place 2 sprays into the nose daily.  16 g  6   No current facility-administered medications on file prior to visit.    Review of Systems:  As per HPI, otherwise negative.    Physical Examination: Filed Vitals:   08/02/13 1249  BP: 158/92  Pulse: 67  Temp: 97.9 F (36.6 C)  Resp: 18   Filed Vitals:   08/02/13 1249  Weight: 204 lb (92.534 kg)   Body mass index is 33.42 kg/(m^2). Ideal Body Weight:    GEN: WDWN, NAD, Non-toxic, A & O x 3 HEENT: Atraumatic, Normocephalic. Neck supple. No masses, No LAD. Ears and Nose: No external deformity. CV: RRR, No M/G/R. No JVD. No thrill. No extra heart sounds. PULM: CTA B, no wheezes, crackles, rhonchi. No retractions. No resp. distress. No accessory muscle use. ABD: S, NT, ND, +BS. No rebound. No HSM. EXTR: No c/c/e NEURO Normal gait.  PSYCH: Normally interactive. Conversant. Not depressed or anxious appearing.  Calm demeanor.  DRE:  normal  Assessment and Plan: Lumbar strain Hematochezia GI mobic Flexeril  Signed,  Ellison Carwin, MD   Results for orders placed in visit on 08/02/13  POCT CBC      Result Value Ref Range   WBC 6.2  4.6 - 10.2 K/uL   Lymph, poc 2.0  0.6 - 3.4  POC LYMPH PERCENT 32.3  10 - 50 %L   MID (cbc) 0.4  0 - 0.9   POC MID % 5.8  0 - 12 %M   POC Granulocyte 3.8  2 - 6.9   Granulocyte percent 61.9  37 - 80 %G   RBC 4.91  4.04 - 5.48 M/uL   Hemoglobin 14.3  12.2 - 16.2 g/dL   HCT, POC 44.8  37.7 - 47.9 %   MCV 91.2  80 - 97 fL   MCH, POC 29.1  27 - 31.2 pg   MCHC 31.9  31.8 - 35.4 g/dL   RDW, POC 14.0     Platelet Count, POC 319  142 - 424 K/uL   MPV 9.2  0 - 99.8 fL  IFOBT (OCCULT BLOOD)      Result Value Ref Range   IFOBT Positive    POCT URINALYSIS DIPSTICK      Result Value Ref Range   Color, UA yellow     Clarity, UA clear     Glucose, UA neg     Bilirubin, UA neg     Ketones, UA neg     Spec Grav, UA 1.010     Blood, UA neg     pH, UA 5.5     Protein, UA neg      Urobilinogen, UA 0.2     Nitrite, UA neg     Leukocytes, UA Negative    POCT UA - MICROSCOPIC ONLY      Result Value Ref Range   WBC, Ur, HPF, POC neg     RBC, urine, microscopic neg     Bacteria, U Microscopic neg     Mucus, UA neg     Epithelial cells, urine per micros 0-1     Crystals, Ur, HPF, POC neg     Casts, Ur, LPF, POC neg     Yeast, UA neg

## 2013-08-02 NOTE — Patient Instructions (Signed)

## 2013-08-08 ENCOUNTER — Telehealth: Payer: Self-pay

## 2013-08-08 NOTE — Telephone Encounter (Signed)
Linda Cisneros, pt is needing her GI referral to be in [place], not [place]. Thanks

## 2013-08-08 NOTE — Telephone Encounter (Signed)
Pt is needing an opthamology referral to dr spainhour in [place]

## 2013-08-11 NOTE — Telephone Encounter (Signed)
Dr. West Carbo is a GI doctor. We put in a referral to GI. This is who she wants to see

## 2013-08-11 NOTE — Telephone Encounter (Signed)
Pt called again to check on status of the eye dr referral to dr spainhour [phone]

## 2013-08-19 ENCOUNTER — Encounter: Payer: Self-pay | Admitting: Family Medicine

## 2013-08-19 ENCOUNTER — Ambulatory Visit (INDEPENDENT_AMBULATORY_CARE_PROVIDER_SITE_OTHER): Payer: 59 | Admitting: Family Medicine

## 2013-08-19 VITALS — BP 143/74 | HR 83 | Temp 96.3°F | Resp 16 | Ht 65.5 in | Wt 206.0 lb

## 2013-08-19 DIAGNOSIS — Z0000 Encounter for general adult medical examination without abnormal findings: Secondary | ICD-10-CM

## 2013-08-19 DIAGNOSIS — E663 Overweight: Secondary | ICD-10-CM

## 2013-08-19 LAB — THYROID PANEL WITH TSH
Free Thyroxine Index: 3.1 (ref 1.0–3.9)
T3 Uptake: 37.2 % — ABNORMAL HIGH (ref 22.5–37.0)
T4, Total: 8.3 ug/dL (ref 5.0–12.5)
TSH: 1.492 u[IU]/mL (ref 0.350–4.500)

## 2013-08-19 LAB — CBC WITH DIFFERENTIAL/PLATELET
Basophils Absolute: 0.1 10*3/uL (ref 0.0–0.1)
Basophils Relative: 1 % (ref 0–1)
EOS PCT: 2 % (ref 0–5)
Eosinophils Absolute: 0.1 10*3/uL (ref 0.0–0.7)
HEMATOCRIT: 40.5 % (ref 36.0–46.0)
Hemoglobin: 14.1 g/dL (ref 12.0–15.0)
LYMPHS ABS: 1.8 10*3/uL (ref 0.7–4.0)
LYMPHS PCT: 36 % (ref 12–46)
MCH: 29.3 pg (ref 26.0–34.0)
MCHC: 34.8 g/dL (ref 30.0–36.0)
MCV: 84 fL (ref 78.0–100.0)
Monocytes Absolute: 0.4 10*3/uL (ref 0.1–1.0)
Monocytes Relative: 7 % (ref 3–12)
NEUTROS ABS: 2.8 10*3/uL (ref 1.7–7.7)
Neutrophils Relative %: 54 % (ref 43–77)
PLATELETS: 311 10*3/uL (ref 150–400)
RBC: 4.82 MIL/uL (ref 3.87–5.11)
RDW: 13.8 % (ref 11.5–15.5)
WBC: 5.1 10*3/uL (ref 4.0–10.5)

## 2013-08-19 LAB — LIPID PANEL
CHOL/HDL RATIO: 3.8 ratio
Cholesterol: 213 mg/dL — ABNORMAL HIGH (ref 0–200)
HDL: 56 mg/dL (ref >39)
LDL CALC: 138 mg/dL — AB (ref 0–99)
Triglycerides: 95 mg/dL (ref <150)
VLDL: 19 mg/dL (ref 0–40)

## 2013-08-19 LAB — POCT URINALYSIS DIPSTICK
Bilirubin, UA: NEGATIVE
Glucose, UA: NEGATIVE
Ketones, UA: NEGATIVE
Leukocytes, UA: NEGATIVE
NITRITE UA: NEGATIVE
PH UA: 5.5
PROTEIN UA: NEGATIVE
Spec Grav, UA: >=1.03
UROBILINOGEN UA: 0.2

## 2013-08-19 LAB — COMPLETE METABOLIC PANEL WITH GFR
ALBUMIN: 4.3 g/dL (ref 3.5–5.2)
ALT: 35 U/L (ref 0–35)
AST: 18 U/L (ref 0–37)
Alkaline Phosphatase: 78 U/L (ref 39–117)
BUN: 15 mg/dL (ref 6–23)
CO2: 25 meq/L (ref 19–32)
Calcium: 9.1 mg/dL (ref 8.4–10.5)
Chloride: 107 mEq/L (ref 96–112)
Creat: 0.76 mg/dL (ref 0.50–1.10)
GFR, EST NON AFRICAN AMERICAN: 86 mL/min
GLUCOSE: 102 mg/dL — AB (ref 70–99)
POTASSIUM: 4.3 meq/L (ref 3.5–5.3)
SODIUM: 141 meq/L (ref 135–145)
TOTAL PROTEIN: 6.8 g/dL (ref 6.0–8.3)
Total Bilirubin: 0.4 mg/dL (ref 0.2–1.2)

## 2013-08-19 LAB — POCT GLYCOSYLATED HEMOGLOBIN (HGB A1C): Hemoglobin A1C: 5.5

## 2013-08-19 NOTE — Progress Notes (Signed)
Subjective:    Patient ID: Linda Cisneros, female    DOB: 1950/10/22, 63 y.o.   MRN: [id number]  HPI  This 63 y.o. Cauc female is here for CPE and biometrics requested by her insurer. She has seasonal allergies controlled w/ Fluticasone. She had rectal bleeding 2 weeks ago; evaluated at 102; no further bleeding reported. GI referral is pending. Weight gain of 10 lbs in last year associated w/ "love of pasta" and lack of exercise. Pt plans to address this by reducing carbs and portion sizes.  HCM: PAP- Current (June 2013- negative).           MMG- 2013           IMM- Current; will discuss Prevnar at future visit.           CRS- GI referral pending.  Patient Active Problem List   Diagnosis Date Noted  . Unspecified vitamin D deficiency 10/15/2012  . Allergic rhinitis 10/15/2012  . 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 Overweight 10/15/2012    Prior to Admission medications   Medication Sig Start Date End Date Taking? Authorizing Provider  fluticasone (FLONASE) 50 MCG/ACT nasal spray Place 2 sprays into the nose daily. 10/14/12 10/15/13 Yes Barton Fanny, MD  meloxicam (MOBIC) 15 MG tablet Take 1 tablet (15 mg total) by mouth daily. 08/02/13   Ellison Carwin, MD   PMHx, Surg Hx, Soc and Fam Hx reviewed.             Review of Systems  Constitutional: Positive for appetite change.  HENT: Positive for ear discharge, ear pain and sinus pressure.   Gastrointestinal: Positive for blood in stool.  Endocrine: Positive for polyphagia.  Allergic/Immunologic: Positive for environmental allergies.      Objective:   Physical Exam  Nursing note and vitals reviewed. Constitutional: She is oriented to person, place, and time. Vital signs are normal. She appears well-developed and well-nourished. No distress.  HENT:  Head: Normocephalic and atraumatic.  Right Ear: Hearing, tympanic membrane, external ear and ear canal normal.    Left Ear: Hearing, tympanic membrane, external ear and ear canal normal.  Nose: Nose normal. No nasal deformity or septal deviation.  Mouth/Throat: Uvula is midline, oropharynx is clear and moist and mucous membranes are normal. No oral lesions. Normal dentition. No dental caries.  Eyes: Conjunctivae and EOM are normal. Pupils are equal, round, and reactive to light. No scleral icterus.  Fundoscopic exam:      The right eye shows no arteriolar narrowing, no hemorrhage and no papilledema. The right eye shows red reflex.       The left eye shows no arteriolar narrowing, no hemorrhage and no papilledema. The left eye shows red reflex.  Recent vision eval by professional- pt reports eyesight has improved.  Neck: Trachea normal and full passive range of motion without pain. Neck supple. No JVD present. No spinous process tenderness and no muscular tenderness present. Carotid bruit is not present. Decreased range of motion present. No mass and no thyromegaly present.  Cardiovascular: Normal rate, regular rhythm, S1 normal, S2 normal and normal heart sounds.   No extrasystoles are present. PMI is not displaced.  Exam reveals no gallop and no friction rub.   No murmur heard. Pulses:      Carotid pulses are 2+ on the right side, and 2+ on the left side.      Radial pulses are 2+ on the right side,  and 2+ on the left side.       Femoral pulses are 2+ on the right side, and 2+ on the left side.      Dorsalis pedis pulses are 1+ on the right side, and 1+ on the left side.       Posterior tibial pulses are 1+ on the right side, and 1+ on the left side.  Pulmonary/Chest: Effort normal and breath sounds normal. No respiratory distress. Right breast exhibits no inverted nipple, no mass, no nipple discharge, no skin change and no tenderness. Left breast exhibits no inverted nipple, no mass, no nipple discharge, no skin change and no tenderness. Breasts are symmetrical.  Abdominal: Soft. Normal appearance and  bowel sounds are normal. She exhibits no distension and no mass. There is no hepatosplenomegaly. There is no tenderness. There is no guarding and no CVA tenderness.  Musculoskeletal:       Cervical back: Normal.       Thoracic back: Normal.       Lumbar back: Normal.  SI joint discomfort; mild degenerative changes in knee joints. Remainder of exam unremarkable.  Lymphadenopathy:       Head (right side): No submental, no submandibular, no tonsillar, no preauricular, no posterior auricular and no occipital adenopathy present.       Head (left side): No submental, no submandibular, no tonsillar, no preauricular, no posterior auricular and no occipital adenopathy present.    She has no cervical adenopathy.    She has no axillary adenopathy.       Right: No inguinal and no supraclavicular adenopathy present.       Left: No inguinal and no supraclavicular adenopathy present.  Neurological: She is alert and oriented to person, place, and time. She has normal strength. She displays no atrophy and no tremor. No cranial nerve deficit or sensory deficit. She exhibits normal muscle tone. She displays a negative Romberg sign. Coordination and gait normal.  Reflex Scores:      Tricep reflexes are 1+ on the right side and 1+ on the left side.      Bicep reflexes are 1+ on the right side and 1+ on the left side.      Brachioradialis reflexes are 2+ on the right side and 1+ on the left side.      Patellar reflexes are 2+ on the right side and 2+ on the left side. Skin: Skin is warm, dry and intact. No ecchymosis, no lesion and no rash noted. She is not diaphoretic. No cyanosis or erythema. No pallor. Nails show no clubbing.  Psychiatric: She has a normal mood and affect. Her speech is normal and behavior is normal. Judgment and thought content normal. Cognition and memory are normal.    Results for orders placed in visit on 08/19/13  POCT URINALYSIS DIPSTICK      Result Value Ref Range   Color, UA yellow      Clarity, UA clear     Glucose, UA neg     Bilirubin, UA neg     Ketones, UA neg     Spec Grav, UA >=1.030     Blood, UA trace-lysed     pH, UA 5.5     Protein, UA neg     Urobilinogen, UA 0.2     Nitrite, UA neg     Leukocytes, UA Negative    POCT GLYCOSYLATED HEMOGLOBIN (HGB A1C)      Result Value Ref Range   Hemoglobin A1C 5.5  Assessment & Plan:  Annual physical exam - Plan: POCT urinalysis dipstick, POCT glycosylated hemoglobin (Hb A1C), Thyroid Panel With TSH, COMPLETE METABOLIC PANEL WITH GFR, Lipid panel, CBC with Differential  Overweight- Encouraged improved nutrition w/ smaller portions and less carbs. Stay active w/ regular walking sessions.

## 2013-08-19 NOTE — Patient Instructions (Addendum)
Keeping You Healthy  Get These Tests  Blood Pressure- Have your blood pressure checked by your healthcare provider at least once a year.  Normal blood pressure is 120/80.  Weight- Have your body mass index (BMI) calculated to screen for obesity.  BMI is a measure of body fat based on height and weight.  You can calculate your own BMI at [employer].it  Cholesterol- Have your cholesterol checked every year.  Diabetes- Have your blood sugar checked every year if you have high blood pressure, high cholesterol, a family history of diabetes or if you are overweight.  Pap Smear- Have a pap smear every 1 to 3 years if you have been sexually active.  If you are older than 65 and recent pap smears have been normal you may not need additional pap smears.  In addition, if you have had a hysterectomy  For benign disease additional pap smears are not necessary.  Mammogram-Yearly mammograms are essential for early detection of breast cancer  Screening for Colon Cancer- Colonoscopy starting at age 4. Screening may begin sooner depending on your family history and other health conditions.  Follow up colonoscopy as directed by your Gastroenterologist.  Screening for Osteoporosis- Screening begins at age 31 with bone density scanning, sooner if you are at higher risk for developing Osteoporosis.  Get these medicines  Calcium with Vitamin D- Your body requires 1200-1500 mg of Calcium a day and [phone] IU of Vitamin D a day.  You can only absorb 500 mg of Calcium at a time therefore Calcium must be taken in 2 or 3 separate doses throughout the day.  Hormones- Hormone therapy has been associated with increased risk for certain cancers and heart disease.  Talk to your healthcare provider about if you need relief from menopausal symptoms.  Aspirin- Ask your healthcare provider about taking Aspirin to prevent Heart Disease and Stroke.  Get these Immuniztions  Flu shot- Every fall  Pneumonia  shot- Once after the age of 70; if you are younger ask your healthcare provider if you need a pneumonia shot.  Tetanus- Every ten years. Next Tetanus due in 2020.  Zostavax- Once after the age of 52 to prevent shingles.  Take these steps  Don't smoke- Your healthcare provider can help you quit. For tips on how to quit, ask your healthcare provider or go to www.smokefree.gov or call 1-800 QUIT-NOW.  Be physically active- Exercise 5 days a week for a minimum of 30 minutes.  If you are not already physically active, start slow and gradually work up to 30 minutes of moderate physical activity.  Try walking, dancing, bike riding, swimming, etc.  Eat a healthy diet- Eat a variety of healthy foods such as fruits, vegetables, whole grains, low fat milk, low fat cheeses, yogurt, lean meats, chicken, fish, eggs, dried beans, tofu, etc.  For more information go to www.thenutritionsource.org  Dental visit- Brush and floss teeth twice daily; visit your dentist twice a year.  Eye exam- Visit your Optometrist or Ophthalmologist yearly.  Drink alcohol in moderation- Limit alcohol intake to one drink or less a day.  Never drink and drive.  Depression- Your emotional health is as important as your physical health.  If you're feeling down or losing interest in things you normally enjoy, please talk to your healthcare provider.  Seat Belts- can save your life; always wear one  Smoke/Carbon Monoxide detectors- These detectors need to be installed on the appropriate level of your home.  Replace batteries at least once a  year.  Violence- If anyone is threatening or hurting you, please tell your healthcare provider.  Living Will/ Health care power of attorney- Discuss with your healthcare provider and family.     Exercise to Lose Weight Exercise and a healthy diet may help you lose weight. Your doctor may suggest specific exercises. EXERCISE IDEAS AND TIPS  Choose low-cost things you enjoy doing, such  as walking, bicycling, or exercising to workout videos.  Take stairs instead of the elevator.  Walk during your lunch break.  Park your car further away from work or school.  Go to a gym or an exercise class.  Start with 5 to 10 minutes of exercise each day. Build up to 30 minutes of exercise 4 to 6 days a week.  Wear shoes with good support and comfortable clothes.  Stretch before and after working out.  Work out until you breathe harder and your heart beats faster.  Drink extra water when you exercise.  Do not do so much that you hurt yourself, feel dizzy, or get very short of breath. Exercises that burn about 150 calories:  Running 1  miles in 15 minutes.  Playing volleyball for 45 to 60 minutes.  Washing and waxing a car for 45 to 60 minutes.  Playing touch football for 45 minutes.  Walking 1  miles in 35 minutes.  Pushing a stroller 1  miles in 30 minutes.  Playing basketball for 30 minutes.  Raking leaves for 30 minutes.  Bicycling 5 miles in 30 minutes.  Walking 2 miles in 30 minutes.  Dancing for 30 minutes.  Shoveling snow for 15 minutes.  Swimming laps for 20 minutes.  Walking up stairs for 15 minutes.  Bicycling 4 miles in 15 minutes.  Gardening for 30 to 45 minutes.  Jumping rope for 15 minutes.  Washing windows or floors for 45 to 60 minutes. Document Released: 05/10/2010 Document Revised: 06/30/2011 Document Reviewed: 05/10/2010 [facility] Patient Information 2014 [place], [place].

## 2013-08-22 NOTE — Progress Notes (Signed)
Quick Note:  Please advise pt regarding following labs... Thyroid function is normal. Metabolic panel/ chemistries are normal; blood sugar a little above normal. Kidney and liver functions are normal. Blood counts are normal. Lipid panel is good except total and LDL ("bad") cholesterol are above normal.  Focus on healthier nutrition and staying physically active.'  Copy to pt.  ______

## 2013-09-21 ENCOUNTER — Ambulatory Visit: Payer: Self-pay | Admitting: Internal Medicine

## 2013-11-07 LAB — HM MAMMOGRAPHY

## 2013-11-18 ENCOUNTER — Encounter: Payer: Self-pay | Admitting: *Deleted

## 2013-11-18 DIAGNOSIS — Z1231 Encounter for screening mammogram for malignant neoplasm of breast: Secondary | ICD-10-CM

## 2014-01-24 IMAGING — CR DG FOOT COMPLETE 3+V*L*
2 series · 2 of 2 positions shown · non-contrast
Comparison: None.

CLINICAL DATA: Foot pain.

LEFT FOOT - COMPLETE 3+ VIEW

[AP]
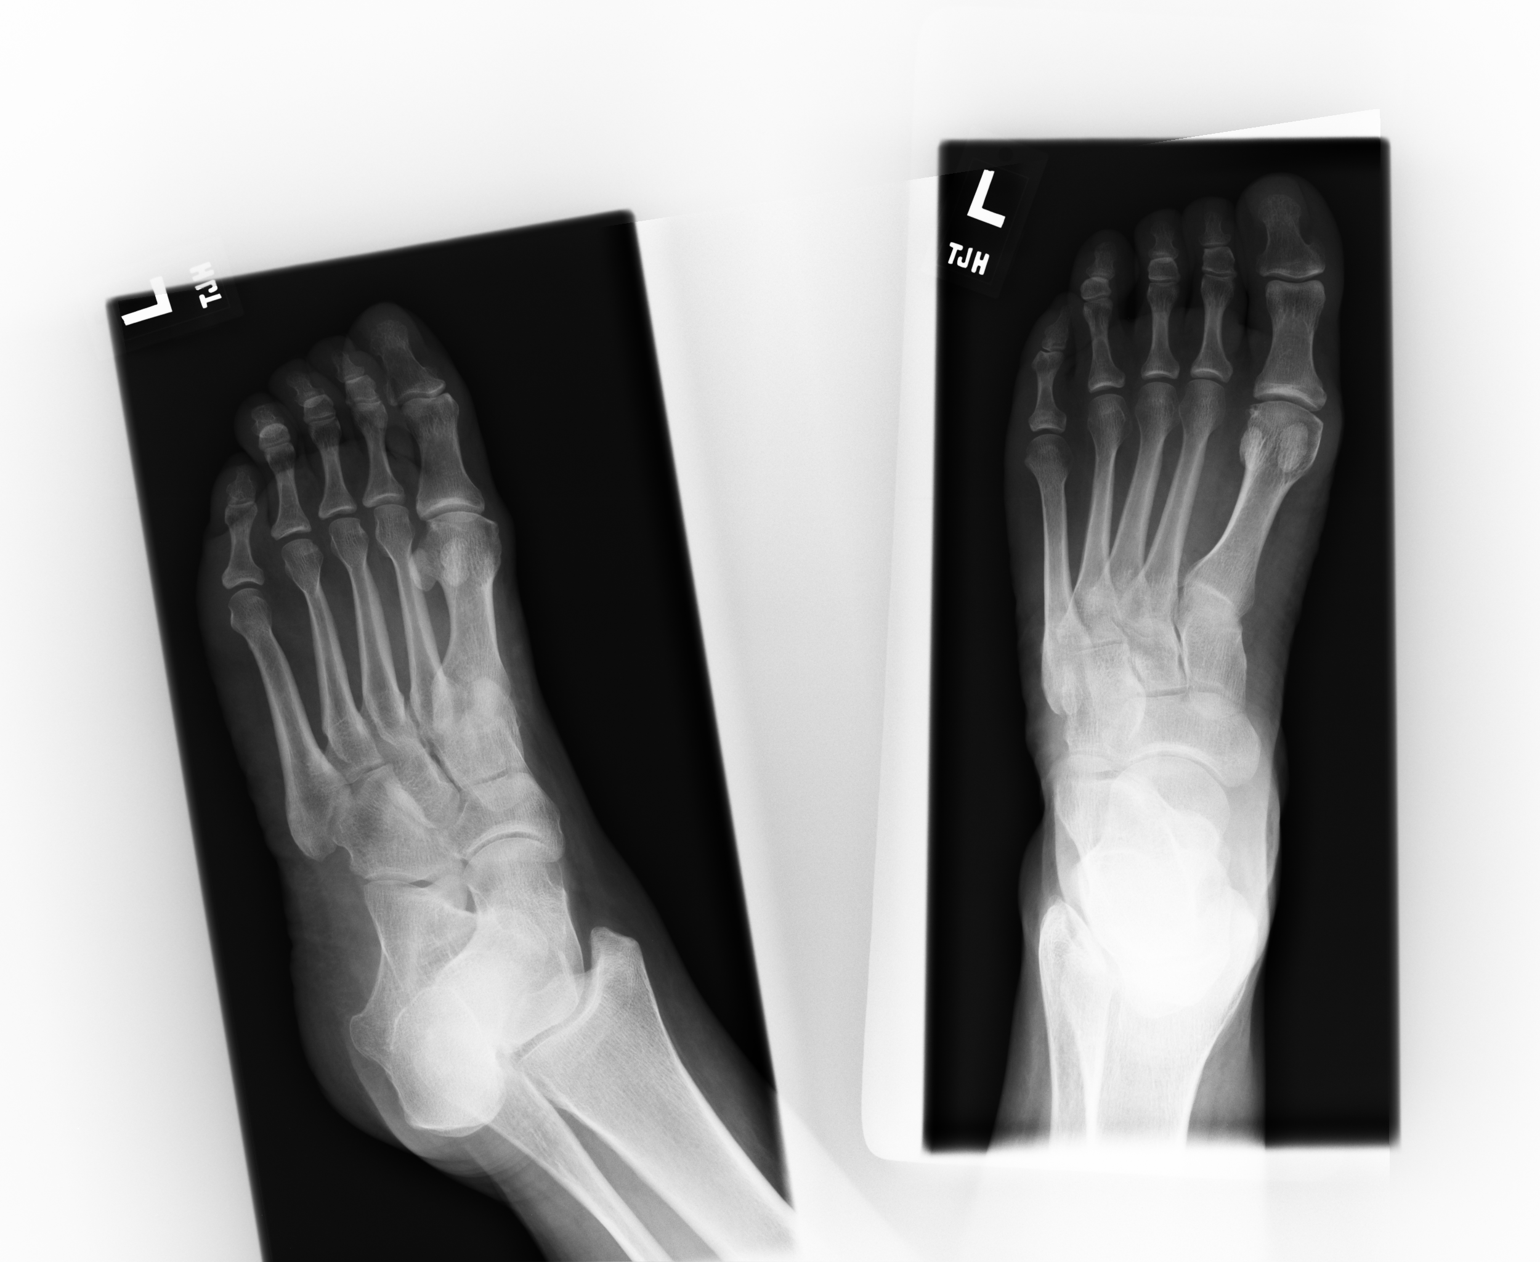

[ap obl int rot]
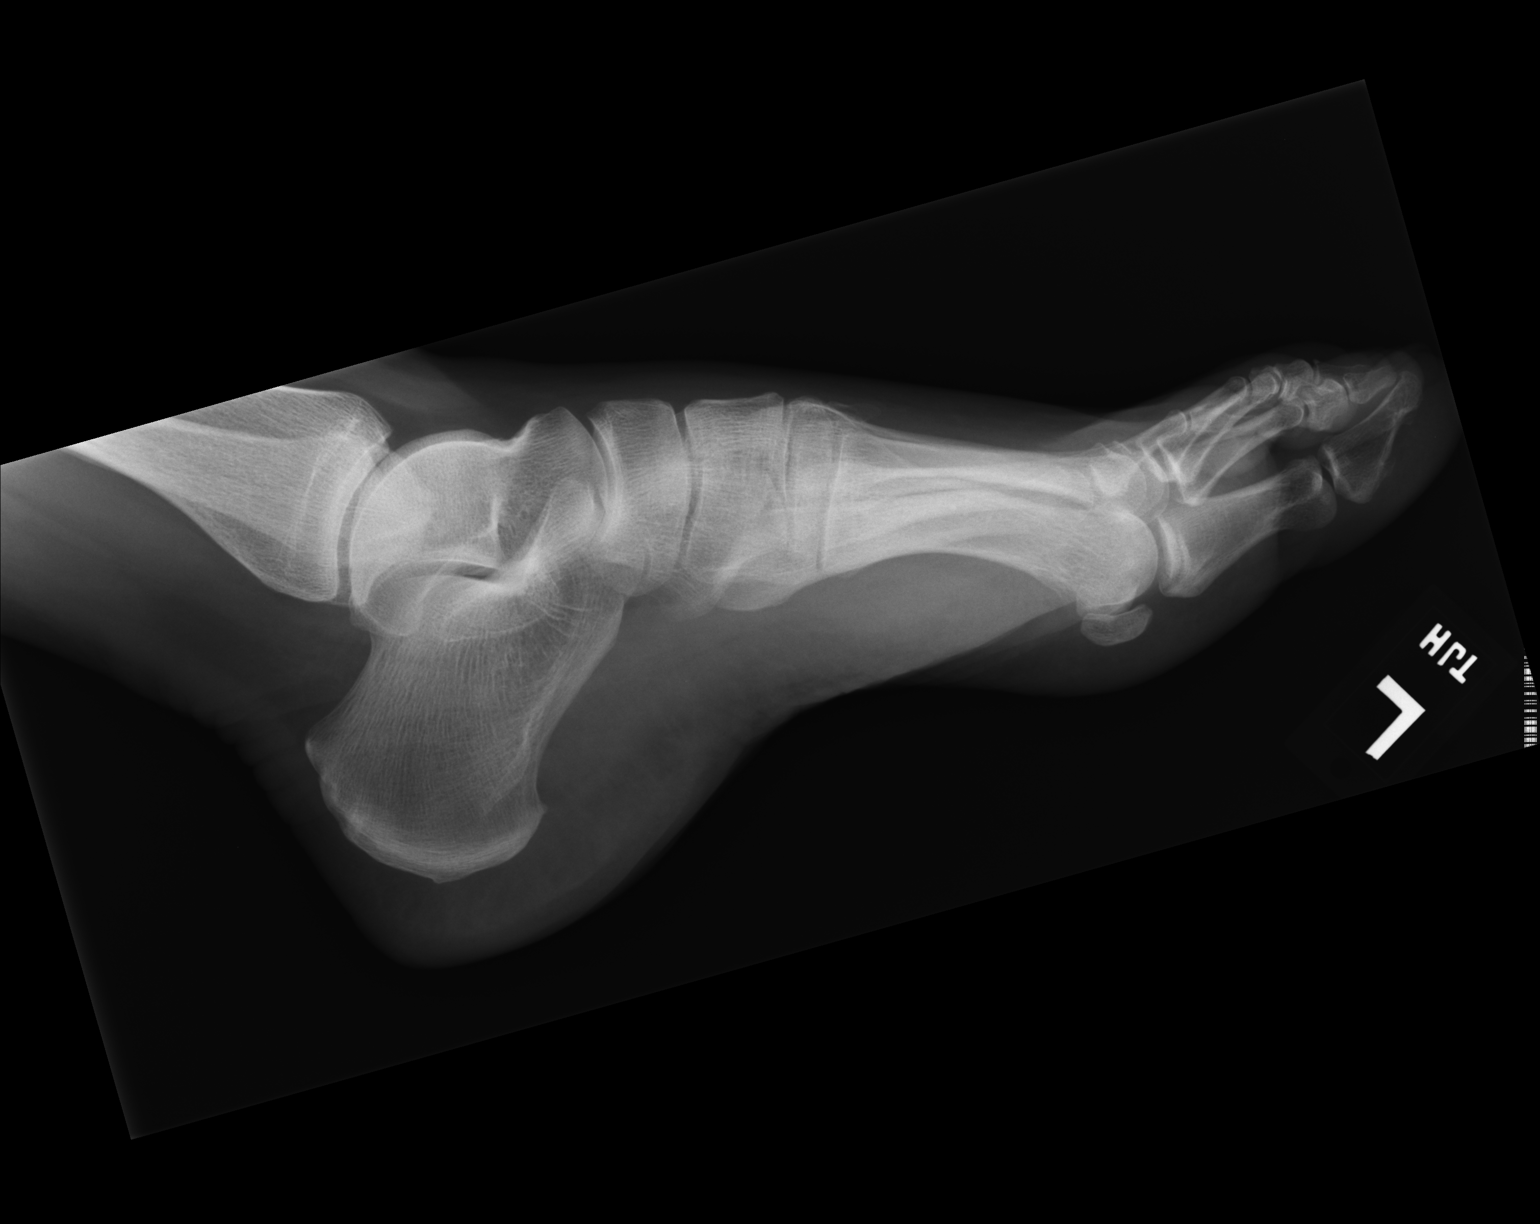

[2 of 2 positions shown; findings below may reference images not displayed]

FINDINGS: No acute bony abnormality.  Specifically, no fracture,
subluxation, or dislocation.  Soft tissues are intact.
IMPRESSION: No acute bony abnormality.

Clinically significant discrepancy from primary report, if
provided: None

## 2014-02-27 ENCOUNTER — Telehealth: Payer: Self-pay

## 2014-02-27 NOTE — Telephone Encounter (Signed)
Pt is needing to get a order for a diagnostic mammo or ultrasound for [facility] to schedule appt

## 2014-02-28 NOTE — Telephone Encounter (Signed)
Pt had Mammogram in October and the results showed pt had dense breast tissue. They advised her to have a more extensive study done. Please advise what you would like to order.

## 2014-02-28 NOTE — Telephone Encounter (Signed)
Pt had mammogram in July 2015; read as negative and recommendation is for 1 year recall. The report is scanned into EPIC and states that she was sent a "normal" letter. I would recommend that she have 3-D mammography in July 2016; she can discuss this with the staff at [facility] when she schedules her mammogram next year.

## 2014-03-01 ENCOUNTER — Telehealth: Payer: Self-pay

## 2014-03-01 NOTE — Telephone Encounter (Signed)
Patient called and states she is having pain in her left breast. This has been going on for 2 weeks. She acknowledges receipt of the VM, however, feels a diagnostic mammogram is still necessary.

## 2014-03-01 NOTE — Telephone Encounter (Signed)
Left detailed message on identified VM

## 2014-03-02 NOTE — Telephone Encounter (Signed)
Spoke to pt she is going to [facility] to discuss the concern of a pain radiating in her breast.

## 2014-03-03 ENCOUNTER — Ambulatory Visit (INDEPENDENT_AMBULATORY_CARE_PROVIDER_SITE_OTHER): Payer: Commercial Managed Care - PPO | Admitting: Family Medicine

## 2014-03-03 VITALS — BP 124/84 | HR 84 | Temp 98.4°F | Resp 16 | Ht 66.0 in | Wt 201.4 lb

## 2014-03-03 DIAGNOSIS — N644 Mastodynia: Secondary | ICD-10-CM

## 2014-03-03 NOTE — Progress Notes (Signed)
Subjective: Almost 63 year old lady who has been having some problems with pain in her left breast over the last 2 weeks. It is a shooting stinging pain in the nipple area of the left breast. It just lasts for 5 or 10 seconds, but is very intense. She has had awakening her from her sleep. It seems to be more in the nighttime though it does now so. Knows of no trauma. She is not sexually involved and has not had any sexual stimulation of the nipple. She works a Designer, multimedia. There is no family history of breast cancer. She had menstrual cycles up until a few years ago when she had an endometrial ablation done. She is not sure she is ever gone through menopausal symptoms.  Objective: Calvin lady, overweight, no acute distress. Breasts are symmetrical with no erythema or dimpling. No axillary nodes. No masses could be palpated in either breast.she has a normal mammography from 3-1/2 months ago, done at [facility]..  Assessment: Left breast pain, etiology undetermined  Plan: Needs referral for complete mammography and breast evaluation.

## 2014-03-03 NOTE — Patient Instructions (Signed)
Referral will be made for complete mammography.  Ibuprofen or aleve as needed  Return if necessary.  If studies are normal please follow up with Dr. Leward Quan in 3 months.

## 2014-03-09 ENCOUNTER — Other Ambulatory Visit: Payer: Self-pay | Admitting: Radiology

## 2014-03-09 DIAGNOSIS — N644 Mastodynia: Secondary | ICD-10-CM

## 2014-04-11 ENCOUNTER — Encounter: Payer: Self-pay | Admitting: Family Medicine

## 2014-10-18 ENCOUNTER — Encounter: Payer: Self-pay | Admitting: Family Medicine

## 2015-07-31 ENCOUNTER — Ambulatory Visit (INDEPENDENT_AMBULATORY_CARE_PROVIDER_SITE_OTHER): Payer: Commercial Managed Care - PPO | Admitting: Physician Assistant

## 2015-07-31 ENCOUNTER — Encounter: Payer: Self-pay | Admitting: Physician Assistant

## 2015-07-31 VITALS — BP 120/80 | HR 69 | Temp 98.4°F | Resp 16 | Ht 66.75 in | Wt 201.2 lb

## 2015-07-31 DIAGNOSIS — R0989 Other specified symptoms and signs involving the circulatory and respiratory systems: Secondary | ICD-10-CM

## 2015-07-31 DIAGNOSIS — R42 Dizziness and giddiness: Secondary | ICD-10-CM

## 2015-07-31 DIAGNOSIS — F458 Other somatoform disorders: Secondary | ICD-10-CM | POA: Diagnosis not present

## 2015-07-31 DIAGNOSIS — R55 Syncope and collapse: Secondary | ICD-10-CM | POA: Diagnosis not present

## 2015-07-31 DIAGNOSIS — E559 Vitamin D deficiency, unspecified: Secondary | ICD-10-CM

## 2015-07-31 DIAGNOSIS — Z1159 Encounter for screening for other viral diseases: Secondary | ICD-10-CM | POA: Diagnosis not present

## 2015-07-31 DIAGNOSIS — J309 Allergic rhinitis, unspecified: Secondary | ICD-10-CM | POA: Diagnosis not present

## 2015-07-31 DIAGNOSIS — R09A2 Foreign body sensation, throat: Secondary | ICD-10-CM

## 2015-07-31 DIAGNOSIS — Z789 Other specified health status: Secondary | ICD-10-CM | POA: Diagnosis not present

## 2015-07-31 DIAGNOSIS — Z114 Encounter for screening for human immunodeficiency virus [HIV]: Secondary | ICD-10-CM | POA: Diagnosis not present

## 2015-07-31 LAB — CBC WITH DIFFERENTIAL/PLATELET
BASOS ABS: 51 {cells}/uL (ref 0–200)
Basophils Relative: 1 %
EOS ABS: 153 {cells}/uL (ref 15–500)
Eosinophils Relative: 3 %
HEMATOCRIT: 41.7 % (ref 35.0–45.0)
Hemoglobin: 14.1 g/dL (ref 11.7–15.5)
Lymphocytes Relative: 33 %
Lymphs Abs: 1683 cells/uL (ref 850–3900)
MCH: 29.6 pg (ref 27.0–33.0)
MCHC: 33.8 g/dL (ref 32.0–36.0)
MCV: 87.4 fL (ref 80.0–100.0)
MONO ABS: 357 {cells}/uL (ref 200–950)
MONOS PCT: 7 %
MPV: 9.4 fL (ref 7.5–12.5)
NEUTROS ABS: 2856 {cells}/uL (ref 1500–7800)
Neutrophils Relative %: 56 %
Platelets: 267 10*3/uL (ref 140–400)
RBC: 4.77 MIL/uL (ref 3.80–5.10)
RDW: 13.8 % (ref 11.0–15.0)
WBC: 5.1 10*3/uL (ref 3.8–10.8)

## 2015-07-31 LAB — COMPREHENSIVE METABOLIC PANEL
ALK PHOS: 77 U/L (ref 33–130)
ALT: 26 U/L (ref 6–29)
AST: 17 U/L (ref 10–35)
Albumin: 4.3 g/dL (ref 3.6–5.1)
BUN: 13 mg/dL (ref 7–25)
CO2: 25 mmol/L (ref 20–31)
CREATININE: 0.83 mg/dL (ref 0.50–0.99)
Calcium: 9.4 mg/dL (ref 8.6–10.4)
Chloride: 106 mmol/L (ref 98–110)
Glucose, Bld: 104 mg/dL — ABNORMAL HIGH (ref 65–99)
Potassium: 4.2 mmol/L (ref 3.5–5.3)
Sodium: 140 mmol/L (ref 135–146)
Total Bilirubin: 0.5 mg/dL (ref 0.2–1.2)
Total Protein: 6.7 g/dL (ref 6.1–8.1)

## 2015-07-31 LAB — LIPID PANEL
Cholesterol: 214 mg/dL — ABNORMAL HIGH (ref 125–200)
HDL: 52 mg/dL (ref >=46)
LDL CALC: 133 mg/dL — AB (ref <130)
Total CHOL/HDL Ratio: 4.1 Ratio (ref <=5.0)
Triglycerides: 146 mg/dL (ref <150)
VLDL: 29 mg/dL (ref <30)

## 2015-07-31 LAB — HIV ANTIBODY (ROUTINE TESTING W REFLEX): HIV: NONREACTIVE

## 2015-07-31 LAB — HEPATITIS C ANTIBODY: HCV AB: NEGATIVE

## 2015-07-31 MED ORDER — MECLIZINE HCL 25 MG PO TABS: 25.0000 mg | ORAL_TABLET | Freq: Three times a day (TID) | ORAL | Status: DC | PRN

## 2015-07-31 MED ORDER — FLUTICASONE PROPIONATE 50 MCG/ACT NA SUSP: 2.0000 | Freq: Every day | NASAL | Status: AC

## 2015-07-31 MED ORDER — RANITIDINE HCL 150 MG PO TABS
150.0000 mg | ORAL_TABLET | Freq: Two times a day (BID) | ORAL | Status: DC
Start: 2015-07-31 — End: 2015-09-18

## 2015-07-31 NOTE — Progress Notes (Signed)
Patient ID: Linda Cisneros, female    DOB: 1951/02/09, 65 y.o.   MRN: [id number]  PCP: Ellsworth Lennox, MD, last here 2015  Subjective:   Chief Complaint  Patient presents with  . Dizziness    x 1 month  . would like Vit d level checked    HPI Presents for evaluation of 1 month history of dizziness.  She is recently getting over an URI that started approx 8 weeks ago. She has had 3 near syncopal episodes that happen approx once a week. She describes the symptoms as feeling of "something in throat", lightheaded, sob, and heaviness in her chest. Denies any spinning sensation.   Episodes have primarily been while driving, when there are cement barriers along the road, or when a road narrows. She gets the feeling of [place] cl[place] and has to turn the air up in the car to help with her symptoms. The episodes only last a few seconds.   She has been feeling anxious recently. Denies any significant stressors. She was describing symptoms to co worker who told her it could be anxiety. Denies chest pain or palpitations. No numbness or tingling with episodes.   She has been taking OTC meclizine with little resolution. She is drinking plenty of PO fluids.  Denies any nausea vomiting.  She has been taking Flonase and Allegra for allergies, and needs a refill of Flonase.  Last labs were performed in May of 2016. She is also due for a one time screening of hepatitis C and HIV.  She has a history of low vitamin D levels and is requesting that we check her levels.   Patient had a piece of cheese for breakfast but would like to have lab work drawn.   Review of Systems Constitutional: Negative for fever, chills, activity change and appetite change.  HENT: Positive for sore throat. Negative for rhinorrhea.  Eyes: Negative.  Respiratory: Positive for cough and chest tightness.  Cardiovascular: Negative for chest pain and palpitations.  Gastrointestinal:  Negative for nausea, vomiting, abdominal pain and diarrhea.  Musculoskeletal: Negative for myalgias and arthralgias.  Neurological: Positive for dizziness, weakness and light-headedness. Negative for syncope, facial asymmetry, numbness and headaches.  Psychiatric/Behavioral: The patient is nervous/anxious.     Patient Active Problem List   Diagnosis Date Noted  . Visit for screening mammogram 11/18/2013  . Unspecified vitamin D deficiency 10/15/2012  . Allergic rhinitis 10/15/2012  . 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 Overweight 10/15/2012     Prior to Admission medications   Medication Sig Start Date End Date Taking? Authorizing Provider  fluticasone (FLONASE) 50 MCG/ACT nasal spray Place 2 sprays into the nose daily. 10/14/12 10/15/13  Barton Fanny, MD  meloxicam (MOBIC) 15 MG tablet Take 1 tablet (15 mg total) by mouth daily. Patient not taking: Reported on 07/31/2015 08/02/13   Roselee Culver, MD     Allergies  Allergen Reactions  . Tetracyclines & Related Rash       Objective:  Physical Exam  Constitutional: She is oriented to person, place, and time. She appears well-developed and well-nourished. She is active and cooperative. No distress.  BP 120/80 mmHg  Pulse 69  Temp(Src) 98.4 F (36.9 C) (Oral)  Resp 16  Ht 5' 6.75" (1.695 m)  Wt 201 lb 3.2 oz (91.264 kg)  BMI 31.77 kg/m2  SpO2 99%  HENT:  Head: Normocephalic and atraumatic.  Right Ear: Hearing, tympanic membrane,  external ear and ear canal normal.  Left Ear: Hearing, tympanic membrane, external ear and ear canal normal.  Nose: Nose normal.  Mouth/Throat: Uvula is midline and oropharynx is clear and moist. No oral lesions. Normal dentition. No uvula swelling.  Eyes: Conjunctivae and EOM are normal. Pupils are equal, round, and reactive to light. Right eye exhibits no discharge. Left eye exhibits no discharge. No scleral icterus.  Neck:  Normal range of motion and phonation normal. Neck supple. No thyromegaly present.  Cardiovascular: Normal rate, regular rhythm and normal heart sounds.   Pulses:      Radial pulses are 2+ on the right side, and 2+ on the left side.  Pulmonary/Chest: Effort normal and breath sounds normal.  Lymphadenopathy:       Head (right side): No tonsillar, no preauricular, no posterior auricular and no occipital adenopathy present.       Head (left side): No tonsillar, no preauricular, no posterior auricular and no occipital adenopathy present.    She has no cervical adenopathy.       Right: No supraclavicular adenopathy present.       Left: No supraclavicular adenopathy present.  Neurological: She is alert and oriented to person, place, and time. She has normal strength. No cranial nerve deficit or sensory deficit.  Reflex Scores:      Patellar reflexes are 2+ on the right side and 2+ on the left side. Skin: Skin is warm, dry and intact. No rash noted. No cyanosis or erythema. Nails show no clubbing.  Psychiatric: She has a normal mood and affect. Her speech is normal and behavior is normal.    EKG reviewed with Dr. Tamala Julian. NSR. No dysrrhthmia, ischemia or LVH.        Assessment & Plan:   1. Dizziness 2. Near syncope 3. Globus sensation Unclear etiology. Does not appear to be cardiac in nature. Cannot exclude vertigo, but very likely anxiety is cause. Await lab results. If normal as expected, will refer to PT for vestibular evaluation/exercises. If normal, will discuss treatment of anxiety. - meclizine (ANTIVERT) 25 MG tablet; Take 1 tablet (25 mg total) by mouth 3 (three) times daily as needed for dizziness.  Dispense: 30 tablet; Refill: 0 - CBC with Differential/Platelet - Comprehensive metabolic panel - EKG XX123456 - ranitidine (ZANTAC) 150 MG tablet; Take 1 tablet (150 mg total) by mouth 2 (two) times daily.  Dispense: 60 tablet; Refill: 1  4. Vitamin D deficiency Await lab results. May  need supplementation. - VITAMIN D 25 Hydroxy (Vit-D Deficiency, Fractures)  5. 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 Await lab results. - Lipid panel  6. Need for hepatitis C screening test - Hepatitis C antibody  7. Screening for HIV (human immunodeficiency virus) - HIV antibody  8. Allergic rhinitis, unspecified allergic rhinitis type - fluticasone (FLONASE) 50 MCG/ACT nasal spray; Place 2 sprays into both nostrils daily.  Dispense: 16 g; Refill: 12   Fara Chute, PA-C Physician Assistant-Certified [facility]

## 2015-07-31 NOTE — Progress Notes (Signed)
Subjective:     Patient ID: Linda Cisneros, female   DOB: [birth year], 65 y.o.   MRN: [id number] Chief Complaint  Patient presents with  . Dizziness    x 1 month    HPI  Linda Cisneros presents for evaluation for 1 month history of dizziness.  She is recently getting over an URI that started approx 8 weeks ago. She has had 3 near syncopal episodes that happen approx once a week. She describes the symptoms as feeling of "something in throat", lightheaded, sob, and heaviness in her chest. Denies any spinning sensation. Driving makes the dizziness worse. While driving she get the feeling of being cla[place] on the interstate and has to turn the air up in the car to help with her symptoms. The episodes only last a few seconds.  She has been feeling anxious recently. Denies any significant stressors. She was describing symptoms to co worker who told her it could be anxiety.  Denies chest pain or palpitations. No numbness or tingling with episodes.  She has been taking OTC meclozine with little resolution. She is drinking plenty of PO fluids. Denies any nausea vomiting. She has been taking Flonase and Allegra for allergies.  She has a history of low vitamin D levels and is requesting that we check her levels.  Last labs were performed in May of 2016. Patient had a piece of cheese for breakfast but would like to have lab work drawn. She is also due for a one time screening of hepatitis C and HIV. Patient aggreeable to having these labs preformed.   Past Medical History  Diagnosis Date  . Allergy   . Cancer ([place])   . Vitamin D deficiency   . Overweight(278.02)    Past Surgical History  Procedure Laterality Date  . Endometrial ablation    . Tonsillectomy and adenoidectomy     Social History   Social History  . Marital Status: Single    Spouse Name: N/A  . Number of Children: N/A  . Years of Education: N/A   Occupational History  . receptionist    Social History Main Topics   . Smoking status: Never Smoker   . Smokeless tobacco: Not on file  . Alcohol Use: Yes  . Drug Use: No  . Sexual Activity: No   Other Topics Concern  . Not on file   Social History Narrative    Current Outpatient Prescriptions on File Prior to Visit  Medication Sig Dispense Refill  . fluticasone (FLONASE) 50 MCG/ACT nasal spray Place 2 sprays into the nose daily. 16 g 6  . meloxicam (MOBIC) 15 MG tablet Take 1 tablet (15 mg total) by mouth daily. (Patient not taking: Reported on 07/31/2015) 30 tablet 1   No current facility-administered medications on file prior to visit.   Allergies  Allergen Reactions  . Tetracyclines & Related Rash     Review of Systems  Constitutional: Negative for fever, chills, activity change and appetite change.  HENT: Positive for sore throat. Negative for rhinorrhea.   Eyes: Negative.   Respiratory: Positive for cough and chest tightness.   Cardiovascular: Negative for chest pain and palpitations.  Gastrointestinal: Negative for nausea, vomiting, abdominal pain and diarrhea.  Musculoskeletal: Negative for myalgias and arthralgias.  Neurological: Positive for dizziness, weakness and light-headedness. Negative for syncope, facial asymmetry, numbness and headaches.  Psychiatric/Behavioral: The patient is nervous/anxious.        Objective:   Physical Exam  Constitutional: She is oriented to person, place, and time.  She appears well-developed and well-nourished. No distress.  HENT:  Head: Normocephalic and atraumatic.  Right Ear: External ear normal.  Left Ear: External ear normal.  Nose: Nose normal.  Mouth/Throat: Oropharynx is clear and moist.  Eyes: EOM are normal. Pupils are equal, round, and reactive to light.  Neck: Normal range of motion. Neck supple. No thyromegaly present.  Cardiovascular: Normal rate, regular rhythm, normal heart sounds and intact distal pulses.   Pulmonary/Chest: Effort normal and breath sounds normal.   Lymphadenopathy:    She has no cervical adenopathy.  Neurological: She is alert and oriented to person, place, and time.  Skin: Skin is warm and dry.  Psychiatric: She has a normal mood and affect. Her behavior is normal. Judgment and thought content normal.    Filed Vitals:   07/31/15 0816  BP: 120/80  Pulse: 69  Temp: 98.4 F (36.9 C)  Resp: 16       Assessment/Plan:     1. Dizziness 2. Near syncope 3. Globus sensation EkG normal today. Likely not cardiac in nature. Etiology is unclear. Possibly due to vertigo, but highly likely due to anxiety. Will await lab results to exclude organic causes. Will refer to PT to vertigo evaluation and exercises. If normal will discuss treatment for anxiety. - meclizine (ANTIVERT) 25 MG tablet; Take 1 tablet (25 mg total) by mouth 3 (three) times daily as needed for dizziness.  Dispense: 30 tablet; Refill: 0 - CBC with Differential/Platelet - Comprehensive metabolic panel - EKG XX123456 - ranitidine (ZANTAC) 150 MG tablet; Take 1 tablet (150 mg total) by mouth 2 (two) times daily.  Dispense: 60 tablet; Refill: 1  4. Vitamin D deficiency Awaiting lab results. May need supplementation. - VITAMIN D 25 Hydroxy (Vit-D Deficiency, Fractures)  5. 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 Patient did have a slice of cheese for breakfast this morning so not fasting. - Lipid panel  6. Need for hepatitis C screening test - Hepatitis C antibody  7. Screening for HIV (human immunodeficiency virus) - HIV antibody  8. Allergic rhinitis, unspecified allergic rhinitis type - fluticasone (FLONASE) 50 MCG/ACT nasal spray; Place 2 sprays into both nostrils daily. Dispense: 16 g; Refill: 12  Tyler Brandley Aldrete PA-S 07/31/2015

## 2015-07-31 NOTE — Patient Instructions (Signed)
     IF you received an x-ray today, you will receive an invoice from [facility]. Please contact [facility] at [phone] with questions or concerns regarding your invoice.   IF you received labwork today, you will receive an invoice from [facility]. Please contact [facility] at [phone] with questions or concerns regarding your invoice.   Our billing staff will not be able to assist you with questions regarding bills from these companies.  You will be contacted with the lab results as soon as they are available. The fastest way to get your results is to activate your My Chart account. Instructions are located on the last page of this paperwork. If you have not heard from us regarding the results in 2 weeks, please contact this office.      

## 2015-08-01 LAB — VITAMIN D 25 HYDROXY (VIT D DEFICIENCY, FRACTURES): VIT D 25 HYDROXY: 18 ng/mL — AB (ref 30–100)

## 2015-09-18 ENCOUNTER — Encounter: Payer: Self-pay | Admitting: Physician Assistant

## 2015-09-18 ENCOUNTER — Ambulatory Visit (INDEPENDENT_AMBULATORY_CARE_PROVIDER_SITE_OTHER): Payer: Commercial Managed Care - PPO | Admitting: Physician Assistant

## 2015-09-18 ENCOUNTER — Encounter: Payer: Commercial Managed Care - PPO | Admitting: Physician Assistant

## 2015-09-18 VITALS — BP 116/80 | HR 68 | Temp 97.7°F | Resp 16 | Ht 65.5 in | Wt 198.8 lb

## 2015-09-18 DIAGNOSIS — R0989 Other specified symptoms and signs involving the circulatory and respiratory systems: Secondary | ICD-10-CM

## 2015-09-18 DIAGNOSIS — R42 Dizziness and giddiness: Secondary | ICD-10-CM | POA: Diagnosis not present

## 2015-09-18 DIAGNOSIS — Z0000 Encounter for general adult medical examination without abnormal findings: Secondary | ICD-10-CM | POA: Diagnosis not present

## 2015-09-18 DIAGNOSIS — F458 Other somatoform disorders: Secondary | ICD-10-CM | POA: Diagnosis not present

## 2015-09-18 DIAGNOSIS — Z124 Encounter for screening for malignant neoplasm of cervix: Secondary | ICD-10-CM | POA: Diagnosis not present

## 2015-09-18 MED ORDER — RANITIDINE HCL 150 MG PO TABS: 150.0000 mg | ORAL_TABLET | Freq: Two times a day (BID) | ORAL | Status: AC

## 2015-09-18 MED ORDER — MECLIZINE HCL 25 MG PO TABS: 25.0000 mg | ORAL_TABLET | Freq: Three times a day (TID) | ORAL | Status: AC | PRN

## 2015-09-18 NOTE — Progress Notes (Signed)
Subjective:    Patient ID: Linda Cisneros, female    DOB: November 08, 1950, 65 y.o.   MRN: [id number] Chief Complaint  Patient presents with  . Annual Exam  . Medication Refill    Meclizine 25 mg, Zantac 150 mg   HPI  Patient presents today for annual physical exam with no complaints.    Patient states dizziness improved with meclizine 25mg , and she stopped taking it 2 weeks ago with no recurrent episodes.    Seasonal allergies well controlled on Allegra.  No change in bowel or bladder habits, no chest pain, weakness, or vasomotor symptoms.   Review of Systems  Constitutional: Negative.   HENT: Negative.   Eyes: Negative.   Respiratory: Negative.   Cardiovascular: Negative.   Gastrointestinal: Negative.   Endocrine: Negative.   Genitourinary: Negative.   Musculoskeletal: Negative.   Skin: Negative.   Allergic/Immunologic: Positive for environmental allergies.  Neurological: Positive for dizziness.  Hematological: Negative.   Psychiatric/Behavioral: Negative.       Patient Active Problem List   Diagnosis Date Noted  . Visit for screening mammogram 11/18/2013  . Vitamin D deficiency 10/15/2012  . Allergic rhinitis 10/15/2012  . 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 Overweight 10/15/2012    Past Medical History  Diagnosis Date  . Allergy   . Cancer ([place])   . Vitamin D deficiency   . Overweight(278.02)     Allergies  Allergen Reactions  . Tetracyclines & Related Rash    Prior to Admission medications   Medication Sig Start Date End Date Taking? Authorizing Provider  FEXOFENADINE HCL PO Take by mouth daily.   Yes Historical Provider, MD  meclizine (ANTIVERT) 25 MG tablet Take 1 tablet (25 mg total) by mouth 3 (three) times daily as needed for dizziness. 07/31/15  Yes Korissa Horsford, PA-C  ranitidine (ZANTAC) 150 MG tablet Take 1 tablet (150 mg total) by mouth 2 (two) times daily. 07/31/15  Yes Damontay Alred, PA-C  fluticasone (FLONASE) 50 MCG/ACT nasal spray Place 2 sprays into both nostrils daily. Patient not taking: Reported on 09/18/2015 07/31/15 07/31/16  Harrison Mons, PA-C  meloxicam (MOBIC) 15 MG tablet Take 1 tablet (15 mg total) by mouth daily. Patient not taking: Reported on 07/31/2015 08/02/13   Roselee Culver, MD    Family History  Problem Relation Age of Onset  . COPD Father   . Heart disease Sister   . Hyperlipidemia Sister   . Heart disease Brother     Social History   Social History  . Marital Status: Single    Spouse Name: N/A  . Number of Children: N/A  . Years of Education: N/A   Occupational History  . receptionist    Social History Main Topics  . Smoking status: Never Smoker   . Smokeless tobacco: Not on file  . Alcohol Use: Yes  . Drug Use: No  . Sexual Activity: No   Other Topics Concern  . Not on file   Social History Narrative       Objective: BP 116/80 mmHg  Pulse 68  Temp(Src) 97.7 F (36.5 C) (Oral)  Resp 16  Ht 5' 5.5" (1.664 m)  Wt 198 lb 12.8 oz (90.175 kg)  BMI 32.57 kg/m2  SpO2 98%   Physical Exam  Constitutional: She is oriented to person, place, and time. Vital signs are normal. She appears well-developed.  HENT:  Head: Normocephalic and atraumatic.  Right Ear: External ear  normal.  Left Ear: External ear normal.  Nose: Nose normal.  Mouth/Throat: Oropharynx is clear and moist. No oropharyngeal exudate.  Eyes: Conjunctivae and EOM are normal. Pupils are equal, round, and reactive to light.  Neck: Normal range of motion. Neck supple. No tracheal deviation present. No thyromegaly present.  Cardiovascular: Normal rate, regular rhythm, normal heart sounds and intact distal pulses.  Exam reveals no gallop and no friction rub.   No murmur heard. Pulmonary/Chest: Effort normal and breath sounds normal. No respiratory distress.  Abdominal: Soft. Hernia confirmed negative in the right inguinal area and confirmed negative in  the left inguinal area.  Genitourinary: Vagina normal and uterus normal. No breast swelling, tenderness, discharge or bleeding. Pelvic exam was performed with patient supine. No labial fusion. There is no rash, tenderness, lesion or injury on the right labia. There is no rash, tenderness, lesion or injury on the left labia. Cervix exhibits no motion tenderness, no discharge and no friability. Right adnexum displays no mass, no tenderness and no fullness. Left adnexum displays no mass, no tenderness and no fullness.  Musculoskeletal: Normal range of motion.  Lymphadenopathy:       Right: No inguinal adenopathy present.       Left: No inguinal adenopathy present.  Neurological: She is alert and oriented to person, place, and time. She has normal reflexes.  Skin: Skin is warm and dry.  Psychiatric: She has a normal mood and affect. Her behavior is normal.         Assessment & Plan:  1. Annual physical exam -No abnormal findings noted. -Provided education on how to remain healthy, including yearly test and screening, important medications and immunizations indicated for her age, and steps important in everyday life to maintain a health.  2. Screening for cervical cancer - Pap IG and HPV (high risk) DNA detection performed today, results pending. -Will inform patient of any abnormal results, if both negative repeat both in 5 years. 3. Globus sensation - resolved with administration of Zantac.  Medication refilled and patient will continue to take medication as needed for flare up. - ranitidine (ZANTAC) 150 MG tablet; Take 1 tablet (150 mg total) by mouth 2 (two) times daily.  Dispense: 60 tablet; Refill: 12  4. Dizziness -resolved with Antivert use, medication refilled and patient will continue to take as needed for symptoms. - meclizine (ANTIVERT) 25 MG tablet; Take 1 tablet (25 mg total) by mouth 3 (three) times daily as needed for dizziness.  Dispense: 30 tablet; Refill: 0  Fara Chute, PA-C Certified Physician Assistant [facility]

## 2015-09-18 NOTE — Patient Instructions (Addendum)
   IF you received an x-ray today, you will receive an invoice from [facility]. Please contact [facility] at [phone] with questions or concerns regarding your invoice.   IF you received labwork today, you will receive an invoice from [facility]. Please contact [facility] at [phone] with questions or concerns regarding your invoice.   Our billing staff will not be able to assist you with questions regarding bills from these companies.  You will be contacted with the lab results as soon as they are available. The fastest way to get your results is to activate your My Chart account. Instructions are located on the last page of this paperwork. If you have not heard from us regarding the results in 2 weeks, please contact this office.    Keeping You Healthy  Get These Tests  Blood Pressure- Have your blood pressure checked by your healthcare provider at least once a year.  Normal blood pressure is 120/80.  Weight- Have your body mass index (BMI) calculated to screen for obesity.  BMI is a measure of body fat based on height and weight.  You can calculate your own BMI at www.nhlbisupport.com/bmi/  Cholesterol- Have your cholesterol checked every year.  Diabetes- Have your blood sugar checked every year if you have high blood pressure, high cholesterol, a family history of diabetes or if you are overweight.  Pap Test - Have a pap test every 1 to 5 years if you have been sexually active.  If you are older than 65 and recent pap tests have been normal you may not need additional pap tests.  In addition, if you have had a hysterectomy  for benign disease additional pap tests are not necessary.  Mammogram-Yearly mammograms are essential for early detection of breast cancer  Screening for Colon Cancer- Colonoscopy starting at age 50. Screening may begin sooner depending on your family history and other health conditions.  Follow up colonoscopy  as directed by your Gastroenterologist.  Screening for Osteoporosis- Screening begins at age 65 with bone density scanning, sooner if you are at higher risk for developing Osteoporosis.  Get these medicines  Calcium with Vitamin D- Your body requires 1200-1500 mg of Calcium a day and 800-1000 IU of Vitamin D a day.  You can only absorb 500 mg of Calcium at a time therefore Calcium must be taken in 2 or 3 separate doses throughout the day.  Hormones- Hormone therapy has been associated with increased risk for certain cancers and heart disease.  Talk to your healthcare provider about if you need relief from menopausal symptoms.  Aspirin- Ask your healthcare provider about taking Aspirin to prevent Heart Disease and Stroke.  Get these Immuniztions  Flu shot- Every fall  Pneumonia shot- Once after the age of 65; if you are younger ask your healthcare provider if you need a pneumonia shot.  Tetanus- Every ten years.  Zostavax- Once after the age of 60 to prevent shingles.  Take these steps  Don't smoke- Your healthcare provider can help you quit. For tips on how to quit, ask your healthcare provider or go to www.smokefree.gov or call 1-800 QUIT-NOW.  Be physically active- Exercise 5 days a week for a minimum of 30 minutes.  If you are not already physically active, start slow and gradually work up to 30 minutes of moderate physical activity.  Try walking, dancing, bike riding, swimming, etc.  Eat a healthy diet- Eat a variety of healthy foods such as fruits, vegetables, whole   grains, low fat milk, low fat cheeses, yogurt, lean meats, chicken, fish, eggs, dried beans, tofu, etc.  For more information go to www.thenutritionsource.org  Dental visit- Brush and floss teeth twice daily; visit your dentist twice a year.  Eye exam- Visit your Optometrist or Ophthalmologist yearly.  Drink alcohol in moderation- Limit alcohol intake to one drink or less a day.  Never drink and  drive.  Depression- Your emotional health is as important as your physical health.  If you're feeling down or losing interest in things you normally enjoy, please talk to your healthcare provider.  Seat Belts- can save your life; always wear one  Smoke/Carbon Monoxide detectors- These detectors need to be installed on the appropriate level of your home.  Replace batteries at least once a year.  Violence- If anyone is threatening or hurting you, please tell your healthcare provider.  Living Will/ Health care power of attorney- Discuss with your healthcare provider and family. 

## 2015-09-19 LAB — PAP IG AND HPV HIGH-RISK: HPV DNA High Risk: NOT DETECTED

## 2015-09-25 ENCOUNTER — Encounter: Payer: Self-pay | Admitting: Physician Assistant

## 2016-10-23 ENCOUNTER — Encounter: Payer: Self-pay | Admitting: Family Medicine

## 2017-10-06 ENCOUNTER — Ambulatory Visit (INDEPENDENT_AMBULATORY_CARE_PROVIDER_SITE_OTHER): Payer: Commercial Managed Care - PPO | Admitting: Emergency Medicine

## 2017-10-06 ENCOUNTER — Encounter: Payer: Self-pay | Admitting: Emergency Medicine

## 2017-10-06 ENCOUNTER — Other Ambulatory Visit: Payer: Self-pay

## 2017-10-06 VITALS — BP 120/70 | HR 70 | Temp 97.9°F | Resp 16 | Ht 66.0 in | Wt 206.6 lb

## 2017-10-06 DIAGNOSIS — Z0000 Encounter for general adult medical examination without abnormal findings: Secondary | ICD-10-CM | POA: Diagnosis not present

## 2017-10-06 NOTE — Progress Notes (Signed)
Linda Cisneros 67 y.o.   Chief Complaint  Patient presents with  . Annual Exam    HISTORY OF PRESENT ILLNESS: This is a 67 y.o. female Here for annual exam; no complaints and no medical concerns.   HPI   Prior to Admission medications   Medication Sig Start Date End Date Taking? Authorizing Provider  FEXOFENADINE HCL PO Take by mouth daily.    [provider]  fluticasone (FLONASE) 50 MCG/ACT nasal spray Place 2 sprays into both nostrils daily. Patient not taking: Reported on 09/18/2015 07/31/15 07/31/16  Harrison Mons, PA-C  meclizine (ANTIVERT) 25 MG tablet Take 1 tablet (25 mg total) by mouth 3 (three) times daily as needed for dizziness. Patient not taking: Reported on 10/06/2017 09/18/15   Harrison Mons, PA-C  meloxicam (MOBIC) 15 MG tablet Take 1 tablet (15 mg total) by mouth daily. Patient not taking: Reported on 07/31/2015 08/02/13   Roselee Culver, MD  ranitidine (ZANTAC) 150 MG tablet Take 1 tablet (150 mg total) by mouth 2 (two) times daily. Patient not taking: Reported on 10/06/2017 09/18/15   Harrison Mons, PA-C    Allergies  Allergen Reactions  . Tetracyclines & Related Rash    Patient Active Problem List   Diagnosis Date Noted  . Visit for screening mammogram 11/18/2013  . Vitamin D deficiency 10/15/2012  . Allergic rhinitis 10/15/2012  . 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 Overweight 10/15/2012    Past Medical History:  Diagnosis Date  . Allergy   . Cancer ([place])   . Overweight(278.02)   . Vitamin D deficiency     Past Surgical History:  Procedure Laterality Date  . ENDOMETRIAL ABLATION    . TONSILLECTOMY AND ADENOIDECTOMY      Social History   Socioeconomic History  . Marital status: Single    Spouse name: Not on file  . Number of children: Not on file  . Years of education: Not on file  . Highest education level: Not on file  Occupational History  . Occupation:  receptionist  Social Needs  . Financial resource strain: Not on file  . Food insecurity:    Worry: Not on file    Inability: Not on file  . Transportation needs:    Medical: Not on file    Non-medical: Not on file  Tobacco Use  . Smoking status: Never Smoker  . Smokeless tobacco: Never Used  Substance and Sexual Activity  . Alcohol use: Yes  . Drug use: No  . Sexual activity: Never  Lifestyle  . Physical activity:    Days per week: Not on file    Minutes per session: Not on file  . Stress: Not on file  Relationships  . Social connections:    Talks on phone: Not on file    Gets together: Not on file    Attends religious service: Not on file    Active member of club or organization: Not on file    Attends meetings of clubs or organizations: Not on file    Relationship status: Not on file  . Intimate partner violence:    Fear of current or ex partner: Not on file    Emotionally abused: Not on file    Physically abused: Not on file    Forced sexual activity: Not on file  Other Topics Concern  . Not on file  Social History Narrative  . Not on file    Family History  Problem Relation Age of Onset  . COPD Father   . Heart disease Sister   . Hyperlipidemia Sister   . Heart disease Brother      Review of Systems  Constitutional: Negative.  Negative for chills, fever and weight loss.  HENT: Negative.  Negative for nosebleeds and sore throat.   Eyes: Negative.  Negative for blurred vision and double vision.  Respiratory: Negative.  Negative for cough and hemoptysis.   Cardiovascular: Negative.  Negative for chest pain and palpitations.  Gastrointestinal: Negative.  Negative for abdominal pain, diarrhea, nausea and vomiting.  Genitourinary: Negative.  Negative for dysuria and hematuria.  Musculoskeletal: Negative.  Negative for myalgias and neck pain.  Skin: Negative.  Negative for rash.  Neurological: Negative.  Negative for dizziness and headaches.   Endo/Heme/Allergies: Negative.   All other systems reviewed and are negative.  Vitals:   10/06/17 1602  BP: 120/70  Pulse: 70  Resp: 16  Temp: 97.9 F (36.6 C)  SpO2: 97%     Physical Exam  Constitutional: She is oriented to person, place, and time. She appears well-developed and well-nourished.  HENT:  Head: Normocephalic and atraumatic.  Right Ear: External ear normal.  Left Ear: External ear normal.  Nose: Nose normal.  Mouth/Throat: Oropharynx is clear and moist.  Eyes: Pupils are equal, round, and reactive to light. Conjunctivae and EOM are normal.  Neck: Normal range of motion. Neck supple.  Cardiovascular: Normal rate, regular rhythm, normal heart sounds and intact distal pulses.  Pulmonary/Chest: Effort normal and breath sounds normal.  Abdominal: Soft. Bowel sounds are normal. She exhibits no distension and no mass. There is no tenderness. There is no rebound.  Musculoskeletal: Normal range of motion.  Neurological: She is alert and oriented to person, place, and time. No sensory deficit. She exhibits normal muscle tone.  Skin: Skin is warm and dry. Capillary refill takes less than 2 seconds.  Psychiatric: She has a normal mood and affect. Her behavior is normal.  Vitals reviewed.    ASSESSMENT & PLAN: Linda Cisneros was seen today for annual exam.  Diagnoses and all orders for this visit:  Routine general medical examination at a health care facility -     CBC with Differential -     Comprehensive metabolic panel -     Hemoglobin A1c -     Lipid panel -     VITAMIN D 25 Hydroxy (Vit-D Deficiency, Fractures)   Patient Instructions       IF you received an x-ray today, you will receive an invoice from [facility]. Please contact [facility] at [phone] with questions or concerns regarding your invoice.   IF you received labwork today, you will receive an invoice from [facility]. Please contact [facility] at [phone] with questions or  concerns regarding your invoice.   Our billing staff will not be able to assist you with questions regarding bills from these companies.  You will be contacted with the lab results as soon as they are available. The fastest way to get your results is to activate your My Chart account. Instructions are located on the last page of this paperwork. If you have not heard from [place] regarding the results in 2 weeks, please contact this office.     Health Maintenance, Female Adopting a healthy lifestyle and getting preventive care can go a long way to promote health and wellness. Talk with your health care provider about what schedule of regular examinations is right for you. This is a  good chance for you to check in with your provider about disease prevention and staying healthy. In between checkups, there are plenty of things you can do on your own. Experts have done a lot of research about which lifestyle changes and preventive measures are most likely to keep you healthy. Ask your health care provider for more information. Weight and diet Eat a healthy diet  Be sure to include plenty of vegetables, fruits, low-fat dairy products, and lean protein.  Do not eat a lot of foods high in solid fats, added sugars, or salt.  Get regular exercise. This is one of the most important things you can do for your health. ? Most adults should exercise for at least 150 minutes each week. The exercise should increase your heart rate and make you sweat (moderate-intensity exercise). ? Most adults should also do strengthening exercises at least twice a week. This is in addition to the moderate-intensity exercise.  Maintain a healthy weight  Body mass index (BMI) is a measurement that can be used to identify possible weight problems. It estimates body fat based on height and weight. Your health care provider can help determine your BMI and help you achieve or maintain a healthy weight.  For females 55 years of age  and older: ? A BMI below 18.5 is considered underweight. ? A BMI of 18.5 to 24.9 is normal. ? A BMI of 25 to 29.9 is considered overweight. ? A BMI of 30 and above is considered obese.  Watch levels of cholesterol and blood lipids  You should start having your blood tested for lipids and cholesterol at 65 years of age, then have this test every 5 years.  You may need to have your cholesterol levels checked more often if: ? Your lipid or cholesterol levels are high. ? You are older than 65 years of age. ? You are at high risk for heart disease.  Cancer screening Lung Cancer  Lung cancer screening is recommended for adults 43-35 years old who are at high risk for lung cancer because of a history of smoking.  A yearly low-dose CT scan of the lungs is recommended for people who: ? Currently smoke. ? Have quit within the past 15 years. ? Have at least a 30-pack-year history of smoking. A pack year is smoking an average of one pack of cigarettes a day for 1 year.  Yearly screening should continue until it has been 15 years since you quit.  Yearly screening should stop if you develop a health problem that would prevent you from having lung cancer treatment.  Breast Cancer  Practice breast self-awareness. This means understanding how your breasts normally appear and feel.  It also means doing regular breast self-exams. Let your health care provider know about any changes, no matter how small.  If you are in your 20s or 30s, you should have a clinical breast exam (CBE) by a health care provider every 1-3 years as part of a regular health exam.  If you are 33 or older, have a CBE every year. Also consider having a breast X-ray (mammogram) every year.  If you have a family history of breast cancer, talk to your health care provider about genetic screening.  If you are at high risk for breast cancer, talk to your health care provider about having an MRI and a mammogram every  year.  Breast cancer gene (BRCA) assessment is recommended for women who have family members with BRCA-related cancers. BRCA-related cancers include: ?  Breast. ? Ovarian. ? Tubal. ? Peritoneal cancers.  Results of the assessment will determine the need for genetic counseling and BRCA1 and BRCA2 testing.  Cervical Cancer Your health care provider may recommend that you be screened regularly for cancer of the pelvic organs (ovaries, uterus, and vagina). This screening involves a pelvic examination, including checking for microscopic changes to the surface of your cervix (Pap test). You may be encouraged to have this screening done every 3 years, beginning at age 33.  For women ages 79-65, health care providers may recommend pelvic exams and Pap testing every 3 years, or they may recommend the Pap and pelvic exam, combined with testing for human papilloma virus (HPV), every 5 years. Some types of HPV increase your risk of cervical cancer. Testing for HPV may also be done on women of any age with unclear Pap test results.  Other health care providers may not recommend any screening for nonpregnant women who are considered low risk for pelvic cancer and who do not have symptoms. Ask your health care provider if a screening pelvic exam is right for you.  If you have had past treatment for cervical cancer or a condition that could lead to cancer, you need Pap tests and screening for cancer for at least 20 years after your treatment. If Pap tests have been discontinued, your risk factors (such as having a new sexual partner) need to be reassessed to determine if screening should resume. Some women have medical problems that increase the chance of getting cervical cancer. In these cases, your health care provider may recommend more frequent screening and Pap tests.  Colorectal Cancer  This type of cancer can be detected and often prevented.  Routine colorectal cancer screening usually begins at 65  years of age and continues through 65 years of age.  Your health care provider may recommend screening at an earlier age if you have risk factors for colon cancer.  Your health care provider may also recommend using home test kits to check for hidden blood in the stool.  A small camera at the end of a tube can be used to examine your colon directly (sigmoidoscopy or colonoscopy). This is done to check for the earliest forms of colorectal cancer.  Routine screening usually begins at age 71.  Direct examination of the colon should be repeated every 5-10 years through 65 years of age. However, you may need to be screened more often if early forms of precancerous polyps or small growths are found.  Skin Cancer  Check your skin from head to toe regularly.  Tell your health care provider about any new moles or changes in moles, especially if there is a change in a mole's shape or color.  Also tell your health care provider if you have a mole that is larger than the size of a pencil eraser.  Always use sunscreen. Apply sunscreen liberally and repeatedly throughout the day.  Protect yourself by wearing long sleeves, pants, a wide-brimmed hat, and sunglasses whenever you are outside.  Heart disease, diabetes, and high blood pressure  High blood pressure causes heart disease and increases the risk of stroke. High blood pressure is more likely to develop in: ? People who have blood pressure in the high end of the normal range (130-139/85-89 mm Hg). ? People who are overweight or obese. ? People who are African American.  If you are 12-76 years of age, have your blood pressure checked every 3-5 years. If you are 40 years  of age or older, have your blood pressure checked every year. You should have your blood pressure measured twice-once when you are at a hospital or clinic, and once when you are not at a hospital or clinic. Record the average of the two measurements. To check your blood pressure  when you are not at a hospital or clinic, you can use: ? An automated blood pressure machine at a pharmacy. ? A home blood pressure monitor.  If you are between 37 years and 68 years old, ask your health care provider if you should take aspirin to prevent strokes.  Have regular diabetes screenings. This involves taking a blood sample to check your fasting blood sugar level. ? If you are at a normal weight and have a low risk for diabetes, have this test once every three years after 65 years of age. ? If you are overweight and have a high risk for diabetes, consider being tested at a younger age or more often. Preventing infection Hepatitis B  If you have a higher risk for hepatitis B, you should be screened for this virus. You are considered at high risk for hepatitis B if: ? You were born in a country where hepatitis B is common. Ask your health care provider which countries are considered high risk. ? Your parents were born in a high-risk country, and you have not been immunized against hepatitis B (hepatitis B vaccine). ? You have HIV or AIDS. ? You use needles to inject street drugs. ? You live with someone who has hepatitis B. ? You have had sex with someone who has hepatitis B. ? You get hemodialysis treatment. ? You take certain medicines for conditions, including cancer, organ transplantation, and autoimmune conditions.  Hepatitis C  Blood testing is recommended for: ? Everyone born from [birth year] through 1963. ? Anyone with known risk factors for hepatitis C.  Sexually transmitted infections (STIs)  You should be screened for sexually transmitted infections (STIs) including gonorrhea and chlamydia if: ? You are sexually active and are younger than 65 years of age. ? You are older than 65 years of age and your health care provider tells you that you are at risk for this type of infection. ? Your sexual activity has changed since you were last screened and you are at an increased  risk for chlamydia or gonorrhea. Ask your health care provider if you are at risk.  If you do not have HIV, but are at risk, it may be recommended that you take a prescription medicine daily to prevent HIV infection. This is called pre-exposure prophylaxis (PrEP). You are considered at risk if: ? You are sexually active and do not regularly use condoms or know the HIV status of your partner(s). ? You take drugs by injection. ? You are sexually active with a partner who has HIV.  Talk with your health care provider about whether you are at high risk of being infected with HIV. If you choose to begin PrEP, you should first be tested for HIV. You should then be tested every 3 months for as long as you are taking PrEP. Pregnancy  If you are premenopausal and you may become pregnant, ask your health care provider about preconception counseling.  If you may become pregnant, take 400 to 800 micrograms (mcg) of folic acid every day.  If you want to prevent pregnancy, talk to your health care provider about birth control (contraception). Osteoporosis and menopause  Osteoporosis is a disease in which the  bones lose minerals and strength with aging. This can result in serious bone fractures. Your risk for osteoporosis can be identified using a bone density scan.  If you are 83 years of age or older, or if you are at risk for osteoporosis and fractures, ask your health care provider if you should be screened.  Ask your health care provider whether you should take a calcium or vitamin D supplement to lower your risk for osteoporosis.  Menopause may have certain physical symptoms and risks.  Hormone replacement therapy may reduce some of these symptoms and risks. Talk to your health care provider about whether hormone replacement therapy is right for you. Follow these instructions at home:  Schedule regular health, dental, and eye exams.  Stay current with your immunizations.  Do not use any  tobacco products including cigarettes, chewing tobacco, or electronic cigarettes.  If you are pregnant, do not drink alcohol.  If you are breastfeeding, limit how much and how often you drink alcohol.  Limit alcohol intake to no more than 1 drink per day for nonpregnant women. One drink equals 12 ounces of beer, 5 ounces of wine, or 1 ounces of hard liquor.  Do not use street drugs.  Do not share needles.  Ask your health care provider for help if you need support or information about quitting drugs.  Tell your health care provider if you often feel depressed.  Tell your health care provider if you have ever been abused or do not feel safe at home. This information is not intended to replace advice given to you by your health care provider. Make sure you discuss any questions you have with your health care provider. Document Released: 10/21/2010 Document Revised: 09/13/2015 Document Reviewed: 01/09/2015 Elsevier Interactive Patient Education  2018 [employer]      Agustina Caroli, MD [facility]

## 2017-10-06 NOTE — Patient Instructions (Addendum)
   IF you received an x-ray today, you will receive an invoice from [facility]. Please contact [facility] at [phone] with questions or concerns regarding your invoice.   IF you received labwork today, you will receive an invoice from [facility]. Please contact [facility] at [phone] with questions or concerns regarding your invoice.   Our billing staff will not be able to assist you with questions regarding bills from these companies.  You will be contacted with the lab results as soon as they are available. The fastest way to get your results is to activate your My Chart account. Instructions are located on the last page of this paperwork. If you have not heard from us regarding the results in 2 weeks, please contact this office.     Health Maintenance, Female Adopting a healthy lifestyle and getting preventive care can go a long way to promote health and wellness. Talk with your health care provider about what schedule of regular examinations is right for you. This is a good chance for you to check in with your provider about disease prevention and staying healthy. In between checkups, there are plenty of things you can do on your own. Experts have done a lot of research about which lifestyle changes and preventive measures are most likely to keep you healthy. Ask your health care provider for more information. Weight and diet Eat a healthy diet  Be sure to include plenty of vegetables, fruits, low-fat dairy products, and lean protein.  Do not eat a lot of foods high in solid fats, added sugars, or salt.  Get regular exercise. This is one of the most important things you can do for your health. ? Most adults should exercise for at least 150 minutes each week. The exercise should increase your heart rate and make you sweat (moderate-intensity exercise). ? Most adults should also do strengthening exercises at least twice a week. This is in addition to the  moderate-intensity exercise.  Maintain a healthy weight  Body mass index (BMI) is a measurement that can be used to identify possible weight problems. It estimates body fat based on height and weight. Your health care provider can help determine your BMI and help you achieve or maintain a healthy weight.  For females 20 years of age and older: ? A BMI below 18.5 is considered underweight. ? A BMI of 18.5 to 24.9 is normal. ? A BMI of 25 to 29.9 is considered overweight. ? A BMI of 30 and above is considered obese.  Watch levels of cholesterol and blood lipids  You should start having your blood tested for lipids and cholesterol at 65 years of age, then have this test every 5 years.  You may need to have your cholesterol levels checked more often if: ? Your lipid or cholesterol levels are high. ? You are older than 65 years of age. ? You are at high risk for heart disease.  Cancer screening Lung Cancer  Lung cancer screening is recommended for adults 55-80 years old who are at high risk for lung cancer because of a history of smoking.  A yearly low-dose CT scan of the lungs is recommended for people who: ? Currently smoke. ? Have quit within the past 15 years. ? Have at least a 30-pack-year history of smoking. A pack year is smoking an average of one pack of cigarettes a day for 1 year.  Yearly screening should continue until it has been 15 years since you quit.  Yearly   screening should stop if you develop a health problem that would prevent you from having lung cancer treatment.  Breast Cancer  Practice breast self-awareness. This means understanding how your breasts normally appear and feel.  It also means doing regular breast self-exams. Let your health care provider know about any changes, no matter how small.  If you are in your 20s or 30s, you should have a clinical breast exam (CBE) by a health care provider every 1-3 years as part of a regular health exam.  If you  are 73 or older, have a CBE every year. Also consider having a breast X-ray (mammogram) every year.  If you have a family history of breast cancer, talk to your health care provider about genetic screening.  If you are at high risk for breast cancer, talk to your health care provider about having an MRI and a mammogram every year.  Breast cancer gene (BRCA) assessment is recommended for women who have family members with BRCA-related cancers. BRCA-related cancers include: ? Breast. ? Ovarian. ? Tubal. ? Peritoneal cancers.  Results of the assessment will determine the need for genetic counseling and BRCA1 and BRCA2 testing.  Cervical Cancer Your health care provider may recommend that you be screened regularly for cancer of the pelvic organs (ovaries, uterus, and vagina). This screening involves a pelvic examination, including checking for microscopic changes to the surface of your cervix (Pap test). You may be encouraged to have this screening done every 3 years, beginning at age 61.  For women ages 90-65, health care providers may recommend pelvic exams and Pap testing every 3 years, or they may recommend the Pap and pelvic exam, combined with testing for human papilloma virus (HPV), every 5 years. Some types of HPV increase your risk of cervical cancer. Testing for HPV may also be done on women of any age with unclear Pap test results.  Other health care providers may not recommend any screening for nonpregnant women who are considered low risk for pelvic cancer and who do not have symptoms. Ask your health care provider if a screening pelvic exam is right for you.  If you have had past treatment for cervical cancer or a condition that could lead to cancer, you need Pap tests and screening for cancer for at least 20 years after your treatment. If Pap tests have been discontinued, your risk factors (such as having a new sexual partner) need to be reassessed to determine if screening should  resume. Some women have medical problems that increase the chance of getting cervical cancer. In these cases, your health care provider may recommend more frequent screening and Pap tests.  Colorectal Cancer  This type of cancer can be detected and often prevented.  Routine colorectal cancer screening usually begins at 65 years of age and continues through 65 years of age.  Your health care provider may recommend screening at an earlier age if you have risk factors for colon cancer.  Your health care provider may also recommend using home test kits to check for hidden blood in the stool.  A small camera at the end of a tube can be used to examine your colon directly (sigmoidoscopy or colonoscopy). This is done to check for the earliest forms of colorectal cancer.  Routine screening usually begins at age 67.  Direct examination of the colon should be repeated every 5-10 years through 65 years of age. However, you may need to be screened more often if early forms of precancerous polyps  or small growths are found.  Skin Cancer  Check your skin from head to toe regularly.  Tell your health care provider about any new moles or changes in moles, especially if there is a change in a mole's shape or color.  Also tell your health care provider if you have a mole that is larger than the size of a pencil eraser.  Always use sunscreen. Apply sunscreen liberally and repeatedly throughout the day.  Protect yourself by wearing long sleeves, pants, a wide-brimmed hat, and sunglasses whenever you are outside.  Heart disease, diabetes, and high blood pressure  High blood pressure causes heart disease and increases the risk of stroke. High blood pressure is more likely to develop in: ? People who have blood pressure in the high end of the normal range (130-139/85-89 mm Hg). ? People who are overweight or obese. ? People who are African American.  If you are 18-39 years of age, have your blood  pressure checked every 3-5 years. If you are 40 years of age or older, have your blood pressure checked every year. You should have your blood pressure measured twice-once when you are at a hospital or clinic, and once when you are not at a hospital or clinic. Record the average of the two measurements. To check your blood pressure when you are not at a hospital or clinic, you can use: ? An automated blood pressure machine at a pharmacy. ? A home blood pressure monitor.  If you are between 55 years and 79 years old, ask your health care provider if you should take aspirin to prevent strokes.  Have regular diabetes screenings. This involves taking a blood sample to check your fasting blood sugar level. ? If you are at a normal weight and have a low risk for diabetes, have this test once every three years after 65 years of age. ? If you are overweight and have a high risk for diabetes, consider being tested at a younger age or more often. Preventing infection Hepatitis B  If you have a higher risk for hepatitis B, you should be screened for this virus. You are considered at high risk for hepatitis B if: ? You were born in a country where hepatitis B is common. Ask your health care provider which countries are considered high risk. ? Your parents were born in a high-risk country, and you have not been immunized against hepatitis B (hepatitis B vaccine). ? You have HIV or AIDS. ? You use needles to inject street drugs. ? You live with someone who has hepatitis B. ? You have had sex with someone who has hepatitis B. ? You get hemodialysis treatment. ? You take certain medicines for conditions, including cancer, organ transplantation, and autoimmune conditions.  Hepatitis C  Blood testing is recommended for: ? Everyone born from 1945 through 1965. ? Anyone with known risk factors for hepatitis C.  Sexually transmitted infections (STIs)  You should be screened for sexually transmitted  infections (STIs) including gonorrhea and chlamydia if: ? You are sexually active and are younger than 65 years of age. ? You are older than 65 years of age and your health care provider tells you that you are at risk for this type of infection. ? Your sexual activity has changed since you were last screened and you are at an increased risk for chlamydia or gonorrhea. Ask your health care provider if you are at risk.  If you do not have HIV, but are at risk,   it may be recommended that you take a prescription medicine daily to prevent HIV infection. This is called pre-exposure prophylaxis (PrEP). You are considered at risk if: ? You are sexually active and do not regularly use condoms or know the HIV status of your partner(s). ? You take drugs by injection. ? You are sexually active with a partner who has HIV.  Talk with your health care provider about whether you are at high risk of being infected with HIV. If you choose to begin PrEP, you should first be tested for HIV. You should then be tested every 3 months for as long as you are taking PrEP. Pregnancy  If you are premenopausal and you may become pregnant, ask your health care provider about preconception counseling.  If you may become pregnant, take 400 to 800 micrograms (mcg) of folic acid every day.  If you want to prevent pregnancy, talk to your health care provider about birth control (contraception). Osteoporosis and menopause  Osteoporosis is a disease in which the bones lose minerals and strength with aging. This can result in serious bone fractures. Your risk for osteoporosis can be identified using a bone density scan.  If you are 34 years of age or older, or if you are at risk for osteoporosis and fractures, ask your health care provider if you should be screened.  Ask your health care provider whether you should take a calcium or vitamin D supplement to lower your risk for osteoporosis.  Menopause may have certain physical  symptoms and risks.  Hormone replacement therapy may reduce some of these symptoms and risks. Talk to your health care provider about whether hormone replacement therapy is right for you. Follow these instructions at home:  Schedule regular health, dental, and eye exams.  Stay current with your immunizations.  Do not use any tobacco products including cigarettes, chewing tobacco, or electronic cigarettes.  If you are pregnant, do not drink alcohol.  If you are breastfeeding, limit how much and how often you drink alcohol.  Limit alcohol intake to no more than 1 drink per day for nonpregnant women. One drink equals 12 ounces of beer, 5 ounces of wine, or 1 ounces of hard liquor.  Do not use street drugs.  Do not share needles.  Ask your health care provider for help if you need support or information about quitting drugs.  Tell your health care provider if you often feel depressed.  Tell your health care provider if you have ever been abused or do not feel safe at home. This information is not intended to replace advice given to you by your health care provider. Make sure you discuss any questions you have with your health care provider. Document Released: 10/21/2010 Document Revised: 09/13/2015 Document Reviewed: 01/09/2015 Elsevier Interactive Patient Education  [employer].

## 2017-10-07 ENCOUNTER — Encounter: Payer: Self-pay | Admitting: *Deleted

## 2017-10-07 LAB — COMPREHENSIVE METABOLIC PANEL
ALBUMIN: 4.2 g/dL (ref 3.6–4.8)
ALT: 16 IU/L (ref 0–32)
AST: 13 IU/L (ref 0–40)
Albumin/Globulin Ratio: 1.8 (ref 1.2–2.2)
Alkaline Phosphatase: 76 IU/L (ref 39–117)
BUN / CREAT RATIO: 19 (ref 12–28)
BUN: 15 mg/dL (ref 8–27)
Bilirubin Total: 0.3 mg/dL (ref 0.0–1.2)
CO2: 22 mmol/L (ref 20–29)
CREATININE: 0.81 mg/dL (ref 0.57–1.00)
Calcium: 9.2 mg/dL (ref 8.7–10.3)
Chloride: 106 mmol/L (ref 96–106)
GFR calc non Af Amer: 77 mL/min/{1.73_m2} (ref >59)
GFR, EST AFRICAN AMERICAN: 89 mL/min/{1.73_m2} (ref >59)
GLUCOSE: 95 mg/dL (ref 65–99)
Globulin, Total: 2.3 g/dL (ref 1.5–4.5)
Potassium: 4.1 mmol/L (ref 3.5–5.2)
Sodium: 142 mmol/L (ref 134–144)
TOTAL PROTEIN: 6.5 g/dL (ref 6.0–8.5)

## 2017-10-07 LAB — CBC WITH DIFFERENTIAL/PLATELET
BASOS ABS: 0.1 10*3/uL (ref 0.0–0.2)
Basos: 1 %
EOS (ABSOLUTE): 0.2 10*3/uL (ref 0.0–0.4)
EOS: 3 %
HEMATOCRIT: 41.1 % (ref 34.0–46.6)
HEMOGLOBIN: 13.9 g/dL (ref 11.1–15.9)
IMMATURE GRANS (ABS): 0 10*3/uL (ref 0.0–0.1)
Immature Granulocytes: 0 %
LYMPHS: 38 %
Lymphocytes Absolute: 2 10*3/uL (ref 0.7–3.1)
MCH: 29.1 pg (ref 26.6–33.0)
MCHC: 33.8 g/dL (ref 31.5–35.7)
MCV: 86 fL (ref 79–97)
MONOCYTES: 6 %
Monocytes Absolute: 0.3 10*3/uL (ref 0.1–0.9)
NEUTROS ABS: 2.6 10*3/uL (ref 1.4–7.0)
Neutrophils: 52 %
Platelets: 275 10*3/uL (ref 150–450)
RBC: 4.78 x10E6/uL (ref 3.77–5.28)
RDW: 13.8 % (ref 12.3–15.4)
WBC: 5.2 10*3/uL (ref 3.4–10.8)

## 2017-10-07 LAB — LIPID PANEL
CHOL/HDL RATIO: 3.6 ratio (ref 0.0–4.4)
Cholesterol, Total: 192 mg/dL (ref 100–199)
HDL: 53 mg/dL (ref >39)
LDL CALC: 120 mg/dL — AB (ref 0–99)
TRIGLYCERIDES: 94 mg/dL (ref 0–149)
VLDL CHOLESTEROL CAL: 19 mg/dL (ref 5–40)

## 2017-10-07 LAB — HEMOGLOBIN A1C
Est. average glucose Bld gHb Est-mCnc: 114 mg/dL
Hgb A1c MFr Bld: 5.6 % (ref 4.8–5.6)

## 2017-10-07 LAB — VITAMIN D 25 HYDROXY (VIT D DEFICIENCY, FRACTURES): Vit D, 25-Hydroxy: 14.9 ng/mL — ABNORMAL LOW (ref 30.0–100.0)

## 2018-03-29 LAB — HM MAMMOGRAPHY

## 2018-04-06 ENCOUNTER — Encounter: Payer: Self-pay | Admitting: *Deleted

## 2022-10-06 ENCOUNTER — Other Ambulatory Visit: Payer: Self-pay | Admitting: Podiatry

## 2022-10-06 ENCOUNTER — Ambulatory Visit: Payer: Medicare PPO | Admitting: Podiatry

## 2022-10-06 ENCOUNTER — Encounter: Payer: Self-pay | Admitting: Podiatry

## 2022-10-06 ENCOUNTER — Ambulatory Visit (INDEPENDENT_AMBULATORY_CARE_PROVIDER_SITE_OTHER): Payer: Medicare PPO

## 2022-10-06 DIAGNOSIS — F419 Anxiety disorder, unspecified: Secondary | ICD-10-CM | POA: Insufficient documentation

## 2022-10-06 DIAGNOSIS — M778 Other enthesopathies, not elsewhere classified: Secondary | ICD-10-CM

## 2022-10-06 DIAGNOSIS — Z9181 History of falling: Secondary | ICD-10-CM | POA: Insufficient documentation

## 2022-10-06 DIAGNOSIS — Z86018 Personal history of other benign neoplasm: Secondary | ICD-10-CM | POA: Insufficient documentation

## 2022-10-06 DIAGNOSIS — M79671 Pain in right foot: Secondary | ICD-10-CM

## 2022-10-06 DIAGNOSIS — R7303 Prediabetes: Secondary | ICD-10-CM | POA: Insufficient documentation

## 2022-10-06 NOTE — Progress Notes (Signed)
Subjective:   Patient ID: Linda Cisneros, female   DOB: 72 y.o.   MRN: [id number]   HPI Chief Complaint  Patient presents with   Foot Pain    Right foot pain that comes and goes pain on the top of foot     72 year old female presents the office for above concerns.  Pain started about 6 weeks ago then last Thursday it went away and no pain since. It feels better when not wearing flip flops. No swelling, numbness or tingling. She did take advil a few times, it may be helped.  She previously saw a doctor in [place] and she was told to get inserts for her shoes but she did not do them yet.  Review of Systems  All other systems reviewed and are negative.  Past Medical History:  Diagnosis Date   Allergy    Cancer (HCC)    Overweight(278.02)    Vitamin D deficiency     Past Surgical History:  Procedure Laterality Date   ENDOMETRIAL ABLATION     TONSILLECTOMY AND ADENOIDECTOMY       Current Outpatient Medications:    ENTRESTO 24-26 MG, Take by mouth., Disp: , Rfl:    hydrocortisone 2.5 % cream, SMARTSIG:Sparingly Topical Twice Daily, Disp: , Rfl:    JARDIANCE 10 MG TABS tablet, Take by mouth., Disp: , Rfl:    metoprolol succinate (TOPROL-XL) 25 MG 24 hr tablet, Take 25 mg by mouth 2 (two) times daily., Disp: , Rfl:    olmesartan (BENICAR) 5 MG tablet, 0 Refill(s), Disp: , Rfl:    OZEMPIC, 0.25 OR 0.5 MG/DOSE, 2 MG/3ML SOPN, Inject 0.5 mg into the skin once a week., Disp: , Rfl:    penicillin v potassium (VEETID) 500 MG tablet, Take by mouth., Disp: , Rfl:    predniSONE (STERAPRED UNI-PAK 21 TAB) 5 MG (21) TBPK tablet, TAKE 6 TABLETS ON DAY 1 AS DIRECTED ON PACKAGE AND DECREASE BY 1 TAB EACH DAY FOR A TOTAL OF 6 DAYS, Disp: , Rfl:    spironolactone (ALDACTONE) 25 MG tablet, Take 25 mg by mouth 2 (two) times daily., Disp: , Rfl:    FEXOFENADINE HCL PO, Take by mouth daily., Disp: , Rfl:    fluticasone (FLONASE) 50 MCG/ACT nasal spray, Place 2 sprays into both nostrils daily.  (Patient not taking: Reported on 09/18/2015), Disp: 16 g, Rfl: 12   meclizine (ANTIVERT) 25 MG tablet, Take 1 tablet (25 mg total) by mouth 3 (three) times daily as needed for dizziness. (Patient not taking: Reported on 10/06/2017), Disp: 30 tablet, Rfl: 0   meloxicam (MOBIC) 15 MG tablet, Take 1 tablet (15 mg total) by mouth daily. (Patient not taking: Reported on 07/31/2015), Disp: 30 tablet, Rfl: 1   ranitidine (ZANTAC) 150 MG tablet, Take 1 tablet (150 mg total) by mouth 2 (two) times daily. (Patient not taking: Reported on 10/06/2017), Disp: 60 tablet, Rfl: 12  Allergies  Allergen Reactions   Tetracyclines & Related Rash           Objective:  Physical Exam  General: AAO x3, NAD  Dermatological: Skin is warm, dry and supple bilateral. There are no open sores, no preulcerative lesions, no rash or signs of infection present.  Vascular: Dorsalis Pedis artery and Posterior Tibial artery pedal pulses are 2/4 bilateral with immedate capillary fill time. . There is no pain with calf compression, swelling, warmth, erythema.   Neruologic: Grossly intact via light touch bilateral.  Negative .  Musculoskeletal: Able to appreciate any  area of pinpoint tenderness.  There is no edema, erythema.  Ankle, subtalar joint range of motion intact.  Flexor, extensor tendons are intact.  MMT 5/5.  Gait: Unassisted, Nonantalgic.       Assessment:   72 year old female with resolved foot pain     Plan:  -Treatment options discussed including all alternatives, risks, and complications -Etiology of symptoms were discussed -X-rays were obtained and reviewed with the patient.  3 views of the foot were obtained.  No evidence of acute fracture.  Metatarsus adductus is present. -Mostly conversation today focused on inserts and shoes.  We discussed different tennis shoes, sandals that she can wear as well as different types of inserts.  Monitor for any reoccurrence of pain and should any symptoms recur to let  me know.  Vivi Barrack DPM

## 2022-10-06 NOTE — Patient Instructions (Signed)
Look at vionic or other type of sandals with good arch support  For inserts I like Powersteps, Supefeet Aetrex  For the sneaker try on [employer], Shon Baton or even a [employer]. If needed you can then add an insert to the shoe but start with a good, supportive shoe.   If was nice to meet you today. If you have any questions or any further concerns, please feel fee to give me a call. You can call our office at [phone] or please feel fee to send me a message through MyChart.

## 2022-10-08 ENCOUNTER — Other Ambulatory Visit: Payer: Self-pay | Admitting: Podiatry

## 2022-10-08 DIAGNOSIS — M79671 Pain in right foot: Secondary | ICD-10-CM

## 2022-10-13 ENCOUNTER — Telehealth: Payer: Self-pay | Admitting: Podiatry

## 2022-10-13 NOTE — Telephone Encounter (Signed)
Pt stated that she is experiencing severe pain in her right foot, hard to apply any pressure. Wants to know what can be done at home to help ease the pain or does she need to come in & be seen. Please advise.

## 2022-10-16 ENCOUNTER — Other Ambulatory Visit: Payer: Self-pay | Admitting: Podiatry

## 2022-10-16 DIAGNOSIS — M778 Other enthesopathies, not elsewhere classified: Secondary | ICD-10-CM

## 2022-10-16 MED ORDER — MELOXICAM 15 MG PO TABS: 15.0000 mg | ORAL_TABLET | Freq: Every day | ORAL | 0 refills | Status: DC | PRN

## 2022-10-16 NOTE — Telephone Encounter (Addendum)
Patient called back and is saying that her foot is very painful, has noticed red streaking going up into right leg. It is also now swollen and cannot get any type of shoe on and has not picked up the recommended short boot yet. Please advise.

## 2022-10-18 LAB — CBC WITH DIFFERENTIAL/PLATELET
Basophils Absolute: 0.1 10*3/uL (ref 0.0–0.2)
Basos: 1 %
EOS (ABSOLUTE): 0.2 10*3/uL (ref 0.0–0.4)
Eos: 3 %
Hematocrit: 46.8 % — ABNORMAL HIGH (ref 34.0–46.6)
Hemoglobin: 15.9 g/dL (ref 11.1–15.9)
Immature Grans (Abs): 0 10*3/uL (ref 0.0–0.1)
Immature Granulocytes: 0 %
Lymphocytes Absolute: 1.7 10*3/uL (ref 0.7–3.1)
Lymphs: 23 %
MCH: 30.5 pg (ref 26.6–33.0)
MCHC: 34 g/dL (ref 31.5–35.7)
MCV: 90 fL (ref 79–97)
Monocytes Absolute: 0.6 10*3/uL (ref 0.1–0.9)
Monocytes: 8 %
Neutrophils Absolute: 4.8 10*3/uL (ref 1.4–7.0)
Neutrophils: 65 %
Platelets: 324 10*3/uL (ref 150–450)
RBC: 5.22 x10E6/uL (ref 3.77–5.28)
RDW: 13.1 % (ref 11.7–15.4)
WBC: 7.3 10*3/uL (ref 3.4–10.8)

## 2022-10-18 LAB — SEDIMENTATION RATE: Sed Rate: 19 mm/hr (ref 0–40)

## 2022-10-18 LAB — C-REACTIVE PROTEIN: CRP: 3 mg/L (ref 0–10)

## 2022-10-18 LAB — URIC ACID: Uric Acid: 4.5 mg/dL (ref 3.0–7.2)

## 2022-10-21 ENCOUNTER — Telehealth: Payer: Self-pay | Admitting: *Deleted

## 2022-10-21 NOTE — Telephone Encounter (Signed)
Called patient giving results from blood work per physician and that a medication was sent to pharmacy, verbalized understanding and said that she had take 2 tablets of the meloxicam, feeling better already and is  going to beach, will call back if pain returns.

## 2022-12-17 ENCOUNTER — Other Ambulatory Visit: Payer: Self-pay | Admitting: Podiatry
